# Patient Record
Sex: Male | Born: 1995 | Race: Black or African American | Hispanic: No | Marital: Single | Smoking: Never smoker
Health system: Southern US, Community
[De-identification: ages and names within clinical notes are randomized; demographics above are authoritative.]

## PROBLEM LIST (undated history)

## (undated) DIAGNOSIS — F329 Major depressive disorder, single episode, unspecified: Secondary | ICD-10-CM

## (undated) DIAGNOSIS — K589 Irritable bowel syndrome without diarrhea: Secondary | ICD-10-CM

## (undated) DIAGNOSIS — F32A Depression, unspecified: Secondary | ICD-10-CM

## (undated) DIAGNOSIS — F419 Anxiety disorder, unspecified: Secondary | ICD-10-CM

## (undated) HISTORY — DX: Anxiety disorder, unspecified: F41.9

## (undated) HISTORY — DX: Major depressive disorder, single episode, unspecified: F32.9

## (undated) HISTORY — DX: Depression, unspecified: F32.A

## (undated) HISTORY — PX: TYMPANOSTOMY TUBE PLACEMENT: SHX32

---
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
## 2017-09-03 ENCOUNTER — Encounter: Payer: Self-pay | Admitting: *Deleted

## 2017-09-03 ENCOUNTER — Other Ambulatory Visit: Payer: Self-pay

## 2017-09-03 ENCOUNTER — Emergency Department
Admission: EM | Admit: 2017-09-03 | Discharge: 2017-09-03 | Disposition: A | Discharge status: Home / Self Care | Payer: BLUE CROSS/BLUE SHIELD | Attending: Emergency Medicine | Admitting: Emergency Medicine

## 2017-09-03 DIAGNOSIS — R112 Nausea with vomiting, unspecified: Secondary | ICD-10-CM | POA: Insufficient documentation

## 2017-09-03 DIAGNOSIS — F4010 Social phobia, unspecified: Secondary | ICD-10-CM | POA: Diagnosis not present

## 2017-09-03 DIAGNOSIS — F329 Major depressive disorder, single episode, unspecified: Secondary | ICD-10-CM | POA: Diagnosis not present

## 2017-09-03 DIAGNOSIS — R1010 Upper abdominal pain, unspecified: Secondary | ICD-10-CM | POA: Insufficient documentation

## 2017-09-03 LAB — CBC
HCT: 43 % (ref 40.0–52.0)
HEMOGLOBIN: 14.8 g/dL (ref 13.0–18.0)
MCH: 29.3 pg (ref 26.0–34.0)
MCHC: 34.5 g/dL (ref 32.0–36.0)
MCV: 84.8 fL (ref 80.0–100.0)
Platelets: 395 10*3/uL (ref 150–440)
RBC: 5.06 MIL/uL (ref 4.40–5.90)
RDW: 12.6 % (ref 11.5–14.5)
WBC: 16 10*3/uL — ABNORMAL HIGH (ref 3.8–10.6)

## 2017-09-03 LAB — COMPREHENSIVE METABOLIC PANEL
ALK PHOS: 74 U/L (ref 38–126)
ALT: 41 U/L (ref 17–63)
ANION GAP: 8 (ref 5–15)
AST: 28 U/L (ref 15–41)
Albumin: 4.4 g/dL (ref 3.5–5.0)
BILIRUBIN TOTAL: 0.5 mg/dL (ref 0.3–1.2)
BUN: 10 mg/dL (ref 6–20)
CALCIUM: 9.4 mg/dL (ref 8.9–10.3)
CO2: 25 mmol/L (ref 22–32)
Chloride: 109 mmol/L (ref 101–111)
Creatinine, Ser: 1.24 mg/dL (ref 0.61–1.24)
GFR calc Af Amer: >60 mL/min (ref >60)
Glucose, Bld: 158 mg/dL — ABNORMAL HIGH (ref 65–99)
Potassium: 4.1 mmol/L (ref 3.5–5.1)
Sodium: 142 mmol/L (ref 135–145)
TOTAL PROTEIN: 7.8 g/dL (ref 6.5–8.1)

## 2017-09-03 LAB — LIPASE, BLOOD: Lipase: 27 U/L (ref 11–51)

## 2017-09-03 NOTE — ED Notes (Signed)
Pt given juice and crackers for po challenge per MD.

## 2017-09-03 NOTE — ED Triage Notes (Signed)
Pt has abd pain with vomiting after eating 2 hot dogs at 2100 tonight.  No diarrhea.  Pt alert.

## 2017-09-03 NOTE — ED Notes (Signed)
Pt unable to void at this time. 

## 2017-09-03 NOTE — ED Provider Notes (Signed)
[facility] Emergency Department Provider Note  ___________________________________________   First MD Initiated Contact with Patient 09/03/17 [phone]     (approximate)  I have reviewed the triage vital signs and the nursing notes.   HISTORY  Chief Complaint Abdominal Pain   HPI Jesse Bailey is a 22 y.o. male with a history of depression and social anxiety disorder who is presenting to the emergency department with upper abdominal pain that has since resolved.  He says that at about 9:00 yesterday he ate 2 hotdogs quickly.  He said he began to have upper abdominal cramping which became severe and lasted for about 3 hours until after he vomited.  He said that he then took a nap and ever since waking up he has felt fine.  Denies any diarrhea.  Denies any abdominal pain at this time and also does not report lower abdominal pain at any point during his course over the last 12 hours.  No past medical history on file.  Patient Active Problem List   Diagnosis Date Noted  . Moderate major depression, single episode ([facility]) 12/21/2016  . Social anxiety disorder 12/21/2016    No past surgical history on file.  Prior to Admission medications   Not on File    Allergies Patient has no known allergies.  No family history on file.  Social History Social History   Tobacco Use  . Smoking status: Never Smoker  . Smokeless tobacco: Never Used  Substance Use Topics  . Alcohol use: No  . Drug use: No    Review of Systems  Constitutional: No fever/chills Eyes: No visual changes. ENT: No sore throat. Cardiovascular: Denies chest pain. Respiratory: Denies shortness of breath. Gastrointestinal: No diarrhea.  No constipation. Genitourinary: Negative for dysuria. Musculoskeletal: Negative for back pain. Skin: Negative for rash. Neurological: Negative for headaches, focal weakness or numbness.   ____________________________________________   PHYSICAL  EXAM:  VITAL SIGNS: ED Triage Vitals  Enc Vitals Group     BP 09/03/17 0238 131/87     Pulse Rate 09/03/17 0238 100     Resp 09/03/17 0238 20     Temp 09/03/17 0238 98.6 F (37 C)     Temp Source 09/03/17 0238 Oral     SpO2 09/03/17 0238 96 %     Weight 09/03/17 0236 230 lb (104.3 kg)     Height 09/03/17 0236  (1.803 m)     Head Circumference --      Peak Flow --      Pain Score 09/03/17 0236 10     Pain Loc --      Pain Edu? --      Excl. in GC? --     Constitutional: Alert and oriented. Well appearing and in no acute distress. Eyes: Conjunctivae are normal.  Head: Atraumatic. Nose: No congestion/rhinnorhea. Mouth/Throat: Mucous membranes are moist.  Neck: No stridor.   Cardiovascular: Normal rate, regular rhythm. Grossly normal heart sounds.   Respiratory: Normal respiratory effort.  No retractions. Lungs CTAB. Gastrointestinal: Soft and nontender.  Specifically, no tender over McBurney's point or any focal tenderness no distention. No CVA tenderness. Musculoskeletal: No lower extremity tenderness nor edema.  No joint effusions. Neurologic:  Normal speech and language. No gross focal neurologic deficits are appreciated. Skin:  Skin is warm, dry and intact. No rash noted. Psychiatric: Mood and affect are normal. Speech and behavior are normal.  ____________________________________________   LABS (all labs ordered are listed, but only abnormal results  are displayed)  Labs Reviewed  COMPREHENSIVE METABOLIC PANEL - Abnormal; Notable for the following components:      Result Value   Glucose, Bld 158 (*)    All other components within normal limits  CBC - Abnormal; Notable for the following components:   WBC 16.0 (*)    All other components within normal limits  LIPASE, BLOOD  URINALYSIS, COMPLETE (UACMP) WITH MICROSCOPIC    ____________________________________________  EKG   ____________________________________________  RADIOLOGY   ____________________________________________   PROCEDURES  Procedure(s) performed:   Procedures  Critical Care performed:   ____________________________________________   INITIAL IMPRESSION / ASSESSMENT AND PLAN / ED COURSE  Pertinent labs & imaging results that were available during my care of the patient were reviewed by me and considered in my medical decision making (see chart for details).  Differential diagnosis includes, but is not limited to, biliary disease (biliary colic, acute cholecystitis, cholangitis, choledocholithiasis, etc), intrathoracic causes for epigastric abdominal pain including ACS, gastritis, duodenitis, pancreatitis, small bowel or large bowel obstruction, abdominal aortic aneurysm, hernia, and gastritis. As part of my medical decision making, I reviewed the following data within the electronic MEDICAL RECORD NUMBER Notes from prior ED visits  ----------------------------------------- 6:50 AM on 09/03/2017 -----------------------------------------  Patient unable to tolerate crackers and juice.  Repalpated the patient's abdomen and he continues to be soft and nontender throughout.  Denies any nausea at this time.  States that he thinks he ate the hotdogs very fast last night.  He says that his normal diet consists of fried food and pizza.  He says he does drink water but does not eat fruits and vegetables.  White blood cell count of 16 but benign abdominal exam throughout the time he has been in the examination area.  Tolerating solids and liquids at this time.  Does not seem that he has an acute abdominal issue that requires surgery such as appendicitis.  However, we did discuss return precautions including any worsening or concerning symptoms but especially abdominal pain and nausea and vomiting.  We also discussed better eating habits such as  incorporating fruits and vegetables.  The patient says that over the past 1 to 2 weeks he has been having regular bowel movements every other day.  However, his mother states that he has had a history of difficulty with moving his bowels.  We all agreed that the dietary changes would be the best way to improve this issue.  The patient as well as the patient's mother who is accompanying him is understanding of this plan willing to comply. ____________________________________________   FINAL CLINICAL IMPRESSION(S) / ED DIAGNOSES  Abdominal pain with nausea and vomiting.    NEW MEDICATIONS STARTED DURING THIS VISIT:  New Prescriptions   No medications on file     Note:  This document was prepared using Dragon voice recognition software and may include unintentional dictation errors.     Myrna Blazer, MD 09/03/17 [phone]

## 2017-10-21 ENCOUNTER — Emergency Department: Payer: BLUE CROSS/BLUE SHIELD

## 2017-10-21 ENCOUNTER — Encounter: Payer: Self-pay | Admitting: Emergency Medicine

## 2017-10-21 ENCOUNTER — Emergency Department
Admission: EM | Admit: 2017-10-21 | Discharge: 2017-10-21 | Disposition: A | Discharge status: Home / Self Care | Payer: BLUE CROSS/BLUE SHIELD | Attending: Emergency Medicine | Admitting: Emergency Medicine

## 2017-10-21 DIAGNOSIS — R112 Nausea with vomiting, unspecified: Secondary | ICD-10-CM | POA: Diagnosis not present

## 2017-10-21 DIAGNOSIS — R1084 Generalized abdominal pain: Secondary | ICD-10-CM | POA: Insufficient documentation

## 2017-10-21 DIAGNOSIS — R109 Unspecified abdominal pain: Secondary | ICD-10-CM

## 2017-10-21 LAB — LIPASE, BLOOD: LIPASE: 26 U/L (ref 11–51)

## 2017-10-21 LAB — CBC
HCT: 46.8 % (ref 40.0–52.0)
Hemoglobin: 16.1 g/dL (ref 13.0–18.0)
MCH: 29.3 pg (ref 26.0–34.0)
MCHC: 34.5 g/dL (ref 32.0–36.0)
MCV: 85 fL (ref 80.0–100.0)
Platelets: 255 10*3/uL (ref 150–440)
RBC: 5.51 MIL/uL (ref 4.40–5.90)
RDW: 13.2 % (ref 11.5–14.5)
WBC: 11.3 10*3/uL — ABNORMAL HIGH (ref 3.8–10.6)

## 2017-10-21 LAB — COMPREHENSIVE METABOLIC PANEL WITH GFR
ALT: 39 U/L (ref 17–63)
AST: 29 U/L (ref 15–41)
Albumin: 5.1 g/dL — ABNORMAL HIGH (ref 3.5–5.0)
Alkaline Phosphatase: 74 U/L (ref 38–126)
Anion gap: 9 (ref 5–15)
BUN: 13 mg/dL (ref 6–20)
CO2: 24 mmol/L (ref 22–32)
Calcium: 9.6 mg/dL (ref 8.9–10.3)
Chloride: 105 mmol/L (ref 101–111)
Creatinine, Ser: 1.08 mg/dL (ref 0.61–1.24)
GFR calc Af Amer: >60 mL/min
GFR calc non Af Amer: >60 mL/min
Glucose, Bld: 124 mg/dL — ABNORMAL HIGH (ref 65–99)
Potassium: 3.8 mmol/L (ref 3.5–5.1)
Sodium: 138 mmol/L (ref 135–145)
Total Bilirubin: 0.9 mg/dL (ref 0.3–1.2)
Total Protein: 7.9 g/dL (ref 6.5–8.1)

## 2017-10-21 LAB — URINALYSIS, COMPLETE (UACMP) WITH MICROSCOPIC
BACTERIA UA: NONE SEEN
BILIRUBIN URINE: NEGATIVE
Glucose, UA: NEGATIVE mg/dL
KETONES UR: NEGATIVE mg/dL
Leukocytes, UA: NEGATIVE
Nitrite: NEGATIVE
PH: 6 (ref 5.0–8.0)
PROTEIN: NEGATIVE mg/dL
Specific Gravity, Urine: 1.015 (ref 1.005–1.030)

## 2017-10-21 MED ORDER — ONDANSETRON 4 MG PO TBDP: 4.0000 mg | ORAL_TABLET | Freq: Three times a day (TID) | ORAL | 0 refills | Status: DC | PRN

## 2017-10-21 NOTE — ED Provider Notes (Signed)
[facility] Emergency Department Provider Note       Time seen: ----------------------------------------- 2:17 PM on 10/21/2017 -----------------------------------------   I have reviewed the triage vital signs and the nursing notes.  HISTORY   Chief Complaint Abdominal Pain    HPI Gary Fleetrajan A Cilento is a 22 y.o. male with a history of depression and social anxiety who presents to the ED for severe right-sided abdominal pain with vomiting that occurred at home.  This differs from the triage note which states that left-sided abdominal pain.  Patient states the same thing happened about a month ago when he was seen here.  Pain seemed to start this morning he was not eating when the event occurred.  1 month ago when he was seen here he was eating too quickly when the pain started.  He had vomiting and then spontaneous resolution of his symptoms.  He denies any diarrhea or constipation.  History reviewed. No pertinent past medical history.  Patient Active Problem List   Diagnosis Date Noted  . Moderate major depression, single episode (HCC) 12/21/2016  . Social anxiety disorder 12/21/2016    History reviewed. No pertinent surgical history.  Allergies Patient has no known allergies.  Social History Social History   Tobacco Use  . Smoking status: Never Smoker  . Smokeless tobacco: Never Used  Substance Use Topics  . Alcohol use: No  . Drug use: No   Review of Systems Constitutional: Negative for fever. Cardiovascular: Negative for chest pain. Respiratory: Negative for shortness of breath. Gastrointestinal: Positive for recent abdominal pain and vomiting Musculoskeletal: Negative for back pain. Skin: Negative for rash. Neurological: Negative for headaches, focal weakness or numbness.  All systems negative/normal/unremarkable except as stated in the HPI  ____________________________________________   PHYSICAL EXAM:  VITAL SIGNS: ED Triage  Vitals  Enc Vitals Group     BP 10/21/17 1228 (!) 148/103     Pulse Rate 10/21/17 1228 97     Resp 10/21/17 1228 18     Temp 10/21/17 1228 97.6 F (36.4 C)     Temp Source 10/21/17 1228 Oral     SpO2 10/21/17 1228 99 %     Weight --      Height --      Head Circumference --      Peak Flow --      Pain Score 10/21/17 1338 0     Pain Loc --      Pain Edu? --      Excl. in GC? --    Constitutional: Alert and oriented. Well appearing and in no distress. Eyes: Conjunctivae are normal. Normal extraocular movements. ENT   Head: Normocephalic and atraumatic.   Nose: No congestion/rhinnorhea.   Mouth/Throat: Mucous membranes are moist.   Neck: No stridor. Cardiovascular: Normal rate, regular rhythm. No murmurs, rubs, or gallops. Respiratory: Normal respiratory effort without tachypnea nor retractions. Breath sounds are clear and equal bilaterally. No wheezes/rales/rhonchi. Gastrointestinal: Soft and nontender. Normal bowel sounds Musculoskeletal: Nontender with normal range of motion in extremities. No lower extremity tenderness nor edema. Neurologic:  Normal speech and language. No gross focal neurologic deficits are appreciated.  Skin:  Skin is warm, dry and intact. No rash noted. Psychiatric: Mood and affect are normal. Speech and behavior are normal.  ____________________________________________  ED COURSE:  As part of my medical decision making, I reviewed the following data within the electronic MEDICAL RECORD NUMBER History obtained from family if available, nursing notes, old chart and ekg, as well  as notes from prior ED visits. Patient presented for abdominal pain, we will assess with labs and imaging as indicated at this time.   Procedures ____________________________________________   LABS (pertinent positives/negatives)  Labs Reviewed  COMPREHENSIVE METABOLIC PANEL - Abnormal; Notable for the following components:      Result Value   Glucose, Bld 124 (*)     Albumin 5.1 (*)    All other components within normal limits  CBC - Abnormal; Notable for the following components:   WBC 11.3 (*)    All other components within normal limits  URINALYSIS, COMPLETE (UACMP) WITH MICROSCOPIC - Abnormal; Notable for the following components:   Color, Urine YELLOW (*)    APPearance CLEAR (*)    Hgb urine dipstick LARGE (*)    All other components within normal limits  LIPASE, BLOOD    RADIOLOGY CT renal protocol IMPRESSION: 1. No acute findings. 2. Small left inguinal hernia containing only mesenteric fat.  ____________________________________________  DIFFERENTIAL DIAGNOSIS   Gas pain, constipation, gastroenteritis, renal colic, appendicitis unlikely  FINAL ASSESSMENT AND PLAN  Flank pain, vomiting, hematuria   Plan: The patient had presented for flank pain and vomiting. Patient's labs did reveal mild leukocytosis. Patient's imaging was negative.  No clear etiology for his symptoms.  He did have some blood in his urine so it is always possible that he had a kidney stone which he passed.  He does not describe sharp pain it was more of a dull pain in the right flank.  Overall he is cleared for outpatient follow-up.   Ulice Dash, MD   Note: This note was generated in part or whole with voice recognition software. Voice recognition is usually quite accurate but there are transcription errors that can and very often do occur. I apologize for any typographical errors that were not detected and corrected.     Emily Filbert, MD 10/21/17 1440

## 2017-10-21 NOTE — ED Notes (Signed)
ED Provider at bedside. 

## 2017-10-21 NOTE — ED Triage Notes (Addendum)
Mom says pt has left side abd pain with vomiting.  Says same thing happened about a month ago and he was seen here. Started this am.

## 2017-11-29 ENCOUNTER — Ambulatory Visit (INDEPENDENT_AMBULATORY_CARE_PROVIDER_SITE_OTHER): Payer: BLUE CROSS/BLUE SHIELD | Admitting: Gastroenterology

## 2017-11-29 ENCOUNTER — Encounter: Payer: Self-pay | Admitting: Gastroenterology

## 2017-11-29 VITALS — BP 141/95 | HR 83 | Ht 69.0 in | Wt 234.8 lb

## 2017-11-29 DIAGNOSIS — K5900 Constipation, unspecified: Secondary | ICD-10-CM

## 2017-11-29 DIAGNOSIS — R109 Unspecified abdominal pain: Secondary | ICD-10-CM | POA: Diagnosis not present

## 2017-11-29 NOTE — Patient Instructions (Signed)
High-Fiber Diet  Fiber, also called dietary fiber, is a type of carbohydrate found in fruits, vegetables, whole grains, and beans. A high-fiber diet can have many health benefits. Your health care provider may recommend a high-fiber diet to help:  · Prevent constipation. Fiber can make your bowel movements more regular.  · Lower your cholesterol.  · Relieve hemorrhoids, uncomplicated diverticulosis, or irritable bowel syndrome.  · Prevent overeating as part of a weight-loss plan.  · Prevent heart disease, type 2 diabetes, and certain cancers.    What is my plan?  The recommended daily intake of fiber includes:  · 38 grams for men under age 50.  · 30 grams for men over age 50.  · 25 grams for women under age 50.  · 21 grams for women over age 50.    You can get the recommended daily intake of dietary fiber by eating a variety of fruits, vegetables, grains, and beans. Your health care provider may also recommend a fiber supplement if it is not possible to get enough fiber through your diet.  What do I need to know about a high-fiber diet?  · Fiber supplements have not been widely studied for their effectiveness, so it is better to get fiber through food sources.  · Always check the fiber content on the nutrition facts label of any prepackaged food. Look for foods that contain at least 5 grams of fiber per serving.  · Ask your dietitian if you have questions about specific foods that are related to your condition, especially if those foods are not listed in the following section.  · Increase your daily fiber consumption gradually. Increasing your intake of dietary fiber too quickly may cause bloating, cramping, or gas.  · Drink plenty of water. Water helps you to digest fiber.  What foods can I eat?  Grains  Whole-grain breads. Multigrain cereal. Oats and oatmeal. Brown rice. Barley. Bulgur wheat. Millet. Bran muffins. Popcorn. Rye wafer crackers.  Vegetables   Sweet potatoes. Spinach. Kale. Artichokes. Cabbage. Broccoli. Green peas. Carrots. Squash.  Fruits  Berries. Pears. Apples. Oranges. Avocados. Prunes and raisins. Dried figs.  Meats and Other Protein Sources  Navy, kidney, pinto, and soy beans. Split peas. Lentils. Nuts and seeds.  Dairy  Fiber-fortified yogurt.  Beverages  Fiber-fortified soy milk. Fiber-fortified orange juice.  Other  Fiber bars.  The items listed above may not be a complete list of recommended foods or beverages. Contact your dietitian for more options.  What foods are not recommended?  Grains  White bread. Pasta made with refined flour. White rice.  Vegetables  Fried potatoes. Canned vegetables. Well-cooked vegetables.  Fruits  Fruit juice. Cooked, strained fruit.  Meats and Other Protein Sources  Fatty cuts of meat. Fried poultry or fried fish.  Dairy  Milk. Yogurt. Cream cheese. Sour cream.  Beverages  Soft drinks.  Other  Cakes and pastries. Butter and oils.  The items listed above may not be a complete list of foods and beverages to avoid. Contact your dietitian for more information.  What are some tips for including high-fiber foods in my diet?  · Eat a wide variety of high-fiber foods.  · Make sure that half of all grains consumed each day are whole grains.  · Replace breads and cereals made from refined flour or white flour with whole-grain breads and cereals.  · Replace white rice with brown rice, bulgur wheat, or millet.  · Start the day with a breakfast that is high in fiber,   such as a cereal that contains at least 5 grams of fiber per serving.  · Use beans in place of meat in soups, salads, or pasta.  · Eat high-fiber snacks, such as berries, raw vegetables, nuts, or popcorn.  This information is not intended to replace advice given to you by your health care provider. Make sure you discuss any questions you have with your health care provider.  Document Released: 04/24/2005 Document Revised: 09/30/2015 Document Reviewed: 10/07/2013   Elsevier Interactive Patient Education © 2018 [employer]vier Inc[employer]

## 2017-11-29 NOTE — Progress Notes (Signed)
Jonathon Bellows MD, MRCP(U.K) [address]  [place]  [place], [place] [address]  Main: [phone]  Fax: [phone]   Gastroenterology Consultation  Referring Provider:     [facility] Primary Care Physician:  [facility] Primary Gastroenterologist:  Dr. Jonathon Bellows  Reason for Consultation:     Abdominal pain         HPI:   BOBY EYER is a 22 y.o. y/o male referred for consultation & management  by Dr. Shari Prows, [facility].    He has been referred for abdominal pain. He was seen in the ER on 10/21/17 for right sided abdominal pain and vomiting , similar episode in 08/2017 , the pain resolved spontaneously. He had some blood in his urine and there was concern for passing a renal stone. CT renal stone study showed only a small left inguinal hernia. LFT's,CBC and lipase showed no significant abnormality .    Abdominal pain: Onset: All began in 08/2017 , just had two episodes of pain , none since  Site :He says that the first episode was eating two hot dogs and his belly began to hurt , the second time it also started hurting when he woke. All over his abdomen , rt side on the second occasions. The first occasion lasted for 2 hours and the second occasion also for 2 hours , nausea+vomiting was also noted.  Radiation: no  Severity :the first time was severe but the second time was less severe  Petra Kuba of pain: like a knot  Aggravating factors: moving  Relieving factors :on its own  Weight loss: none  NSAID use: none  PPI use :none - denies any heart burn  Gall bladder surgery: no - none in the family had issues with gall bladder  Frequency of bowel movements: once a week - cant recall if he had a bowel movement prior to his ER visit  Change in bowel movements: no  Relief with bowel movements: yes  Gas/Bloating/Abdominal distension: yes     Hepatic Function Latest Ref Rng & Units 10/21/2017 09/03/2017 12/21/2016  Total Protein 6.5 - 8.1 g/dL  7.9 7.8 7.9  Albumin 3.5 - 5.0 g/dL 5.1(H) 4.4 4.9  AST 15 - 41 U/L 29 28 35  ALT 17 - 63 U/L 39 41 47  Alk Phosphatase 38 - 126 U/L 74 74 98  Total Bilirubin 0.3 - 1.2 mg/dL 0.9 0.5 0.8     No past medical history on file.  No past surgical history on file.  Prior to Admission medications   Medication Sig Start Date End Date Taking? Authorizing Provider  ondansetron (ZOFRAN ODT) 4 MG disintegrating tablet Take 1 tablet (4 mg total) by mouth every 8 (eight) hours as needed for nausea or vomiting. 10/21/17   Earleen Newport, MD    No family history on file.   Social History   Tobacco Use  . Smoking status: Never Smoker  . Smokeless tobacco: Never Used  Substance Use Topics  . Alcohol use: No  . Drug use: No    Allergies as of 11/29/2017  . (No Known Allergies)    Review of Systems:    All systems reviewed and negative except where noted in HPI.   Physical Exam:  There were no vitals taken for this visit. No LMP for male patient. Psych:  Alert and cooperative. Normal mood and affect. General:   Alert,  Well-developed, well-nourished, pleasant and cooperative in NAD Head:  Normocephalic and atraumatic. Eyes:  Sclera clear, no icterus.   Conjunctiva pink. Ears:  Normal auditory acuity. Nose:  No deformity, discharge, or lesions. Mouth:  No deformity or lesions,oropharynx pink & moist. Neck:  Supple; no masses or thyromegaly. Lungs:  Respirations even and unlabored.  Clear throughout to auscultation.   No wheezes, crackles, or rhonchi. No acute distress. Heart:  Regular rate and rhythm; no murmurs, clicks, rubs, or gallops. Abdomen:  Normal bowel sounds.  No bruits.  Soft, non-tender and non-distended without masses, hepatosplenomegaly or hernias noted.  No guarding or rebound tenderness.    Neurologic:  Alert and oriented x3;  grossly normal neurologically. Skin:  Intact without significant lesions or rashes. No jaundice. Lymph Nodes:  No significant cervical  adenopathy. Psych:  Alert and cooperative. Normal mood and affect.  Imaging Studies: No results found.  Assessment and Plan:   CONROY GORACKE is a 22 y.o. y/o male has been referred for  abdominal pain . The episodes occurred only on two occasions and has resolved since . Normal LFT's . Differentials are food poisoning +/- IBS-C. Diet devoid of fiber.   Plan  1. High fiber diet - patient information provided 2. Fiber pills provided samples 3. Daily miralax.    Follow up in 4 weeks  Dr Jonathon Bellows MD,MRCP(U.K)

## 2018-01-03 ENCOUNTER — Encounter: Payer: Self-pay | Admitting: Gastroenterology

## 2018-01-03 ENCOUNTER — Ambulatory Visit: Payer: BLUE CROSS/BLUE SHIELD | Admitting: Gastroenterology

## 2018-03-24 ENCOUNTER — Other Ambulatory Visit: Payer: Self-pay

## 2018-03-24 ENCOUNTER — Encounter: Payer: Self-pay | Admitting: Emergency Medicine

## 2018-03-24 ENCOUNTER — Ambulatory Visit (INDEPENDENT_AMBULATORY_CARE_PROVIDER_SITE_OTHER)
Admission: EM | Admit: 2018-03-24 | Discharge: 2018-03-24 | Disposition: A | Discharge status: Home / Self Care | Payer: BLUE CROSS/BLUE SHIELD | Source: Home / Self Care | Attending: Emergency Medicine | Admitting: Emergency Medicine

## 2018-03-24 ENCOUNTER — Emergency Department
Admission: EM | Admit: 2018-03-24 | Discharge: 2018-03-24 | Disposition: A | Discharge status: Home / Self Care | Payer: BLUE CROSS/BLUE SHIELD | Attending: Emergency Medicine | Admitting: Emergency Medicine

## 2018-03-24 DIAGNOSIS — R1111 Vomiting without nausea: Secondary | ICD-10-CM | POA: Diagnosis not present

## 2018-03-24 DIAGNOSIS — R1110 Vomiting, unspecified: Secondary | ICD-10-CM | POA: Insufficient documentation

## 2018-03-24 DIAGNOSIS — Z5321 Procedure and treatment not carried out due to patient leaving prior to being seen by health care provider: Secondary | ICD-10-CM | POA: Diagnosis not present

## 2018-03-24 LAB — COMPREHENSIVE METABOLIC PANEL
ALK PHOS: 85 U/L (ref 38–126)
ALT: 36 U/L (ref 0–44)
ANION GAP: 10 (ref 5–15)
AST: 27 U/L (ref 15–41)
Albumin: 5 g/dL (ref 3.5–5.0)
BILIRUBIN TOTAL: 0.7 mg/dL (ref 0.3–1.2)
BUN: 15 mg/dL (ref 6–20)
CALCIUM: 9.7 mg/dL (ref 8.9–10.3)
CO2: 26 mmol/L (ref 22–32)
Chloride: 106 mmol/L (ref 98–111)
Creatinine, Ser: 1.33 mg/dL — ABNORMAL HIGH (ref 0.61–1.24)
GFR calc non Af Amer: >60 mL/min (ref >60)
Glucose, Bld: 130 mg/dL — ABNORMAL HIGH (ref 70–99)
POTASSIUM: 4.5 mmol/L (ref 3.5–5.1)
Sodium: 142 mmol/L (ref 135–145)
TOTAL PROTEIN: 8.2 g/dL — AB (ref 6.5–8.1)

## 2018-03-24 LAB — LIPASE, BLOOD: Lipase: 20 U/L (ref 11–51)

## 2018-03-24 LAB — CBC WITH DIFFERENTIAL/PLATELET
Abs Immature Granulocytes: 0.08 10*3/uL — ABNORMAL HIGH (ref 0.00–0.07)
BASOS PCT: 0 %
Basophils Absolute: 0 10*3/uL (ref 0.0–0.1)
EOS ABS: 0 10*3/uL (ref 0.0–0.5)
EOS PCT: 0 %
HEMATOCRIT: 47.5 % (ref 39.0–52.0)
Hemoglobin: 16.2 g/dL (ref 13.0–17.0)
IMMATURE GRANULOCYTES: 1 %
LYMPHS PCT: 9 %
Lymphs Abs: 1.4 10*3/uL (ref 0.7–4.0)
MCH: 29.3 pg (ref 26.0–34.0)
MCHC: 34.1 g/dL (ref 30.0–36.0)
MCV: 85.9 fL (ref 80.0–100.0)
MONO ABS: 0.8 10*3/uL (ref 0.1–1.0)
MONOS PCT: 5 %
Neutro Abs: 13.2 10*3/uL — ABNORMAL HIGH (ref 1.7–7.7)
Neutrophils Relative %: 85 %
PLATELETS: 266 10*3/uL (ref 150–400)
RBC: 5.53 MIL/uL (ref 4.22–5.81)
RDW: 12.5 % (ref 11.5–15.5)
WBC: 15.5 10*3/uL — ABNORMAL HIGH (ref 4.0–10.5)
nRBC: 0 % (ref 0.0–0.2)

## 2018-03-24 LAB — URINALYSIS, COMPLETE (UACMP) WITH MICROSCOPIC
Glucose, UA: NEGATIVE mg/dL
Hgb urine dipstick: NEGATIVE
LEUKOCYTES UA: NEGATIVE
Nitrite: NEGATIVE
PROTEIN: 30 mg/dL — AB
Specific Gravity, Urine: >1.03 — ABNORMAL HIGH (ref 1.005–1.030)
pH: 6 (ref 5.0–8.0)

## 2018-03-24 MED ORDER — SODIUM CHLORIDE 0.9 % IV BOLUS
1000.0000 mL | Freq: Once | INTRAVENOUS | Status: AC
  Administered 2018-03-24: 1000 mL via INTRAVENOUS

## 2018-03-24 MED ORDER — PROMETHAZINE HCL 25 MG/ML IJ SOLN
25.0000 mg | Freq: Once | INTRAMUSCULAR | Status: AC
  Administered 2018-03-24: 25 mg via INTRAMUSCULAR

## 2018-03-24 MED ORDER — KETOROLAC TROMETHAMINE 30 MG/ML IJ SOLN
30.0000 mg | Freq: Once | INTRAMUSCULAR | Status: AC
  Administered 2018-03-24: 30 mg via INTRAVENOUS

## 2018-03-24 NOTE — Discharge Instructions (Addendum)
[facility].  Do not have anything to eat or drink until his evaluation is complete.

## 2018-03-24 NOTE — ED Triage Notes (Signed)
Pt not currently vomiting in triage.

## 2018-03-24 NOTE — ED Notes (Signed)
Pedialyte given for po challenge °

## 2018-03-24 NOTE — ED Triage Notes (Signed)
[facility] seen [facility] this afternoon for vomiting since this morning. Pt reports he has had multiple episodes of vomiting, last episode was at the urgent care.  Pt given zofran and phenergan. [facility]  Pt denies any sxs of nausea at this time.    Pt denies any pain. Pt has no sick contacts, denies any drug or alcohol use.

## 2018-03-24 NOTE — ED Provider Notes (Signed)
HPI  SUBJECTIVE:  Jesse Bailey is a 22 y.o. male who presents with multiple episodes of non-bloody, bilious vomiting starting this morning.  He estimates that he has had over 10 episodes.  He reports throbbing, intermittent, hours along right flank/abdominal pain with this. The Abdominal pain is not associated with vomiting, walking, movement. patient States that the car ride here was not painful.  Mother states he is unable to tolerate p.o. at all.  He denies nausea, diarrhea, fevers, abdominal distention.  No change in urine output.  No lightheadedness, dizziness, syncope.  No raw or undercooked foods, questionable leftovers, contacts with similar symptoms.  He had a normal bowel movement this morning.  No back pain.  No dysuria, urgency, frequency, cloudy or odorous urine, hematuria.  No alcohol or marijuana use.  No headache.  No GU complaints.  Symptoms are  identical to 2 previous episodes of vomiting that he has had this year.  Mother gave him 4 mg of Zofran without improvement in his symptoms.  There are no aggravating factors.  Twice seen in the ER this year for this.  Visit on 4/29 for the same, he had a mild leukocytosis at 18.  Then on 6/16 for the same, he had large hematuria, so a noncontrast CT was done.  It was negative for nephrolithiasis.  Small left fat-containing inguinal hernia noted.  Evaluated by GI on 7/25.  Thought to have constipation, possibly irritable bowel syndrome versus food poisoning.  He was sent home with fiber pills, high-fiber diet, daily MiraLAX and told to follow-up in 4 weeks.  History reviewed. No pertinent past medical history.  History reviewed. No pertinent surgical history.  Family History  Problem Relation Age of Onset  . Hypertension Mother   . Hyperlipidemia Father     Social History   Tobacco Use  . Smoking status: Never Smoker  . Smokeless tobacco: Never Used  Substance Use Topics  . Alcohol use: No  . Drug use: No    No current  facility-administered medications for this encounter.   Current Outpatient Medications:  .  ondansetron (ZOFRAN ODT) 4 MG disintegrating tablet, Take 1 tablet (4 mg total) by mouth every 8 (eight) hours as needed for nausea or vomiting., Disp: 20 tablet, Rfl: 0  No Known Allergies   ROS  As noted in HPI.   Physical Exam  BP (!) 143/89 (BP Location: Left Arm)   Pulse 80   Temp (!) 97.5 F (36.4 C) (Oral)   Resp 18   Wt 106.5 kg   SpO2 98%   BMI 34.67 kg/m   Constitutional: Well developed, well nourished, no acute distress and actively vomiting yellowish-greenish liquid. Eyes:  EOMI, conjunctiva normal bilaterally HENT: Normocephalic, atraumatic,mucus membranes moist Respiratory: Normal inspiratory effort Cardiovascular: Normal rate, normal rhythm, no murmurs, rubs, gallops GI: Normal appearance, soft, nontender, nondistended.  Negative Murphy, negative McBurney.  No appreciable masses.  No suprapubic, flank tenderness. Back: No CVAT skin: No rash, skin intact Musculoskeletal: no deformities Neurologic: Alert & oriented x 3, no focal neuro deficits Psychiatric: Speech and behavior appropriate   ED Course   Medications  promethazine (PHENERGAN) injection 25 mg (25 mg Intramuscular Given 03/24/18 1228)  sodium chloride 0.9 % bolus 1,000 mL (0 mLs Intravenous Stopped 03/24/18 1335)  ketorolac (TORADOL) 30 MG/ML injection 30 mg (30 mg Intravenous Given 03/24/18 1326)    Orders Placed This Encounter  Procedures  . Urine culture    Standing Status:   Standing  Number of Occurrences:   1    Order Specific Question:   Patient immune status    Answer:   Normal  . CBC with Differential    Standing Status:   Standing    Number of Occurrences:   1  . Lipase, blood    Standing Status:   Standing    Number of Occurrences:   1  . Comprehensive metabolic panel    Standing Status:   Standing    Number of Occurrences:   1  . Urinalysis, Complete w Microscopic    Standing  Status:   Standing    Number of Occurrences:   1  . Insert peripheral IV    Standing Status:   Standing    Number of Occurrences:   1    Results for orders placed or performed during the hospital encounter of 03/24/18 (from the past 24 hour(s))  CBC with Differential     Status: Abnormal   Collection Time: 03/24/18 12:45 PM  Result Value Ref Range   WBC 15.5 (H) 4.0 - 10.5 K/uL   RBC 5.53 4.22 - 5.81 MIL/uL   Hemoglobin 16.2 13.0 - 17.0 g/dL   HCT 16.1 09.6 - 04.5 %   MCV 85.9 80.0 - 100.0 fL   MCH 29.3 26.0 - 34.0 pg   MCHC 34.1 30.0 - 36.0 g/dL   RDW 40.9 81.1 - 91.4 %   Platelets 266 150 - 400 K/uL   nRBC 0.0 0.0 - 0.2 %   Neutrophils Relative % 85 %   Neutro Abs 13.2 (H) 1.7 - 7.7 K/uL   Lymphocytes Relative 9 %   Lymphs Abs 1.4 0.7 - 4.0 K/uL   Monocytes Relative 5 %   Monocytes Absolute 0.8 0.1 - 1.0 K/uL   Eosinophils Relative 0 %   Eosinophils Absolute 0.0 0.0 - 0.5 K/uL   Basophils Relative 0 %   Basophils Absolute 0.0 0.0 - 0.1 K/uL   Immature Granulocytes 1 %   Abs Immature Granulocytes 0.08 (H) 0.00 - 0.07 K/uL  Lipase, blood     Status: None   Collection Time: 03/24/18 12:45 PM  Result Value Ref Range   Lipase 20 11 - 51 U/L  Comprehensive metabolic panel     Status: Abnormal   Collection Time: 03/24/18 12:45 PM  Result Value Ref Range   Sodium 142 135 - 145 mmol/L   Potassium 4.5 3.5 - 5.1 mmol/L   Chloride 106 98 - 111 mmol/L   CO2 26 22 - 32 mmol/L   Glucose, Bld 130 (H) 70 - 99 mg/dL   BUN 15 6 - 20 mg/dL   Creatinine, Ser 7.82 (H) 0.61 - 1.24 mg/dL   Calcium 9.7 8.9 - 95.6 mg/dL   Total Protein 8.2 (H) 6.5 - 8.1 g/dL   Albumin 5.0 3.5 - 5.0 g/dL   AST 27 15 - 41 U/L   ALT 36 0 - 44 U/L   Alkaline Phosphatase 85 38 - 126 U/L   Total Bilirubin 0.7 0.3 - 1.2 mg/dL   GFR calc non Af Amer >60 >60 mL/min   GFR calc Af Amer >60 >60 mL/min   Anion gap 10 5 - 15  Urinalysis, Complete w Microscopic     Status: Abnormal   Collection Time: 03/24/18  12:45 PM  Result Value Ref Range   Color, Urine YELLOW YELLOW   APPearance HAZY (A) CLEAR   Specific Gravity, Urine >1.030 (H) 1.005 - 1.030   pH 6.0  5.0 - 8.0   Glucose, UA NEGATIVE NEGATIVE mg/dL   Hgb urine dipstick NEGATIVE NEGATIVE   Bilirubin Urine SMALL (A) NEGATIVE   Ketones, ur TRACE (A) NEGATIVE mg/dL   Protein, ur 30 (A) NEGATIVE mg/dL   Nitrite NEGATIVE NEGATIVE   Leukocytes, UA NEGATIVE NEGATIVE   Squamous Epithelial / LPF 0-5 0 - 5   WBC, UA 0-5 0 - 5 WBC/hpf   RBC / HPF 0-5 0 - 5 RBC/hpf   Bacteria, UA MANY (A) NONE SEEN   Mucus PRESENT    Amorphous Crystal PRESENT    No results found.  ED Clinical Impression  Intractable vomiting without nausea, unspecified vomiting type   ED Assessment/Plan  I had mom give the patient 8 mg of Zofran, but the patient had persistent vomiting.  Giving him 25 mg of Phenergan.  We will check a UA, CBC, CMP, lipase, give him a liter of fluids.  His abdomen is soft, completely nontender, nondistended, I am not convinced that he has an obstruction, appendicitis, pancreatitis, cholecystitis, other biliary disease,  perforation.  He has been worked up by GI and in the ER in the past for the exact same thing, which included an abdominal CT.  His labs are normal except for a leukocytosis at 15.5.  UA was resulted after patient left.  He has ketones and small bilirubin his urine, and many bacteria however he has no nitrites or esterase.  We will send this off for culture.  I would be surprised if his symptoms are being caused by a UTI or STD as he has no urinary complaints.  On reevaluation, patient still complaining of abdominal pain.  Repeat abdominal exam benign.  will give 30 mg Toradol IV.  Pt states that the Toradol helped a little bit, but he still has abdominal pain. Notified by staff that patient is vomiting again, will transfer to the hospital for intractable vomiting despite 8 milligrams of Zofran and 25 mg of Phenergan.  He is stable  to go by private vehicle.  Discussed  transfer with Herbert SetaHeather, triage nurse.  Discussed rationale for transfer to the emergency department with patient and mother.  They agree with plan.  Meds ordered this encounter  Medications  . promethazine (PHENERGAN) injection 25 mg  . sodium chloride 0.9 % bolus 1,000 mL  . ketorolac (TORADOL) 30 MG/ML injection 30 mg    *This clinic note was created using Scientist, clinical (histocompatibility and immunogenetics)Dragon dictation software. Therefore, there may be occasional mistakes despite careful proofreading.   ?   Domenick GongMortenson, Jeter Tomey, MD 03/24/18 1356

## 2018-03-24 NOTE — ED Notes (Signed)
Pt reports that he is feeling better and no longer wants to be seen, pt reports he has not been sick anymore since he was [facility]. NAD noted. PT advised to return to ED if sx's return.

## 2018-03-24 NOTE — ED Triage Notes (Signed)
Pt here for vomiting since this morning along with nausea. Mom did give him a 4mg  zofran without relief. Is actively vomiting right now. Unable to keep fluids down.

## 2018-03-26 LAB — URINE CULTURE
CULTURE: NO GROWTH
Special Requests: NORMAL

## 2018-04-04 ENCOUNTER — Other Ambulatory Visit: Payer: Self-pay

## 2018-04-04 ENCOUNTER — Emergency Department: Payer: BLUE CROSS/BLUE SHIELD

## 2018-04-04 ENCOUNTER — Emergency Department
Admission: EM | Admit: 2018-04-04 | Discharge: 2018-04-04 | Disposition: A | Discharge status: Home / Self Care | Payer: BLUE CROSS/BLUE SHIELD | Attending: Emergency Medicine | Admitting: Emergency Medicine

## 2018-04-04 DIAGNOSIS — R1032 Left lower quadrant pain: Secondary | ICD-10-CM | POA: Diagnosis not present

## 2018-04-04 DIAGNOSIS — R109 Unspecified abdominal pain: Secondary | ICD-10-CM | POA: Diagnosis present

## 2018-04-04 LAB — COMPREHENSIVE METABOLIC PANEL
ALBUMIN: 4.6 g/dL (ref 3.5–5.0)
ALT: 36 U/L (ref 0–44)
ANION GAP: 11 (ref 5–15)
AST: 24 U/L (ref 15–41)
Alkaline Phosphatase: 76 U/L (ref 38–126)
BILIRUBIN TOTAL: 0.8 mg/dL (ref 0.3–1.2)
BUN: 9 mg/dL (ref 6–20)
CHLORIDE: 105 mmol/L (ref 98–111)
CO2: 25 mmol/L (ref 22–32)
Calcium: 9.6 mg/dL (ref 8.9–10.3)
Creatinine, Ser: 1.24 mg/dL (ref 0.61–1.24)
GFR calc Af Amer: >60 mL/min (ref >60)
GFR calc non Af Amer: >60 mL/min (ref >60)
GLUCOSE: 129 mg/dL — AB (ref 70–99)
POTASSIUM: 4.6 mmol/L (ref 3.5–5.1)
SODIUM: 141 mmol/L (ref 135–145)
Total Protein: 7.4 g/dL (ref 6.5–8.1)

## 2018-04-04 LAB — CBC WITH DIFFERENTIAL/PLATELET
Abs Immature Granulocytes: 0.06 10*3/uL (ref 0.00–0.07)
BASOS ABS: 0.1 10*3/uL (ref 0.0–0.1)
Basophils Relative: 0 %
EOS ABS: 0 10*3/uL (ref 0.0–0.5)
EOS PCT: 0 %
HEMATOCRIT: 45.2 % (ref 39.0–52.0)
HEMOGLOBIN: 15.8 g/dL (ref 13.0–17.0)
IMMATURE GRANULOCYTES: 1 %
LYMPHS ABS: 1.7 10*3/uL (ref 0.7–4.0)
LYMPHS PCT: 13 %
MCH: 29.5 pg (ref 26.0–34.0)
MCHC: 35 g/dL (ref 30.0–36.0)
MCV: 84.3 fL (ref 80.0–100.0)
Monocytes Absolute: 0.5 10*3/uL (ref 0.1–1.0)
Monocytes Relative: 4 %
NEUTROS PCT: 82 %
NRBC: 0 % (ref 0.0–0.2)
Neutro Abs: 10.9 10*3/uL — ABNORMAL HIGH (ref 1.7–7.7)
Platelets: 294 10*3/uL (ref 150–400)
RBC: 5.36 MIL/uL (ref 4.22–5.81)
RDW: 11.9 % (ref 11.5–15.5)
WBC: 13.3 10*3/uL — ABNORMAL HIGH (ref 4.0–10.5)

## 2018-04-04 LAB — LIPASE, BLOOD: Lipase: 23 U/L (ref 11–51)

## 2018-04-04 MED ORDER — IOPAMIDOL (ISOVUE-300) INJECTION 61%
100.0000 mL | Freq: Once | INTRAVENOUS | Status: AC | PRN
  Administered 2018-04-04: 100 mL via INTRAVENOUS

## 2018-04-04 MED ORDER — ONDANSETRON HCL 4 MG/2ML IJ SOLN
4.0000 mg | Freq: Once | INTRAMUSCULAR | Status: AC
  Administered 2018-04-04: 4 mg via INTRAVENOUS
  Filled 2018-04-04: qty 2

## 2018-04-04 MED ORDER — IOPAMIDOL (ISOVUE-300) INJECTION 61%
30.0000 mL | Freq: Once | INTRAVENOUS | Status: AC | PRN
  Administered 2018-04-04: 30 mL via ORAL

## 2018-04-04 NOTE — Discharge Instructions (Addendum)

## 2018-04-04 NOTE — ED Notes (Signed)
Pt only able to drink one bottle of contrast. Ok per Dr. Roxan Hockeyobinson to go ahead with CT. CT notified.

## 2018-04-04 NOTE — ED Provider Notes (Signed)
[facility] Emergency Department Provider Note _____   First MD Initiated Contact with Patient 04/04/18 [phone]     (approximate)  I have reviewed the triage vital signs and the nursing notes.   HISTORY  Chief Complaint Abdominal Pain  HPI Jesse Bailey is a 22 y.o. male below list of chronic medical conditions including anxiety depression and recent evaluation for abdominal pain on   Past Medical History Patient Active Problem List   Diagnosis Date Noted  . Moderate major depression, single episode (HCC) 12/21/2016  . Social anxiety disorder 12/21/2016    Past Surgical History None  Prior to Admission medications   Medication Sig Start Date End Date Taking? Authorizing Provider  ondansetron (ZOFRAN ODT) 4 MG disintegrating tablet Take 1 tablet (4 mg total) by mouth every 8 (eight) hours as needed for nausea or vomiting. 10/21/17   Emily Filbert, MD    Allergies None  Family History  Problem Relation Age of Onset  . Hypertension Mother   . Hyperlipidemia Father     Social History Social History   Tobacco Use  . Smoking status: Never Smoker  . Smokeless tobacco: Never Used  Substance Use Topics  . Alcohol use: No  . Drug use: No    Review of Systems Constitutional: No fever/chills Eyes: No visual changes. ENT: No sore throat. Cardiovascular: Denies chest pain. Respiratory: Denies shortness of breath. Gastrointestinal: positive for abdominal pain and vomiting  No diarrhea.  No constipation. Genitourinary: Negative for dysuria. Musculoskeletal: Negative for neck pain.  Negative for back pain. Integumentary: Negative for rash. Neurological: Negative for headaches, focal weakness or numbness.  ____________________________________________   PHYSICAL EXAM:  VITAL SIGNS: ED Triage Vitals  Enc Vitals Group     BP 04/04/18 0533 131/88     Pulse Rate 04/04/18 0533 90     Resp 04/04/18 0533 20     Temp 04/04/18 0533 97.8 F  (36.6 C)     Temp Source 04/04/18 0533 Oral     SpO2 04/04/18 0533 97 %     Weight 04/04/18 0532 95.3 kg (210 lb)     Height 04/04/18 0532 1.778 m (5\' 10" )     Head Circumference --      Peak Flow --      Pain Score 04/04/18 0532 5     Pain Loc --      Pain Edu? --      Excl. in GC? --     Constitutional: Alert and oriented. Well appearing and in no acute distress. Eyes: Conjunctivae are normal. Mouth/Throat: Mucous membranes are moist.  Oropharynx non-erythematous. Neck: No stridor.   Cardiovascular: Normal rate, regular rhythm. Good peripheral circulation. Grossly normal heart sounds. Respiratory: Normal respiratory effort.  No retractions. Lungs CTAB. Gastrointestinal: left lower quadrant/right lower quadrant tenderness to palpation. No distention.   Musculoskeletal: No lower extremity tenderness nor edema. No gross deformities of extremities. Neurologic:  Normal speech and language. No gross focal neurologic deficits are appreciated.  Skin:  Skin is warm, dry and intact. No rash noted. Psychiatric: Mood and affect are normal. Speech and behavior are normal.  ____________________________________________   LABS (all labs ordered are listed, but only abnormal results are displayed)  Labs Reviewed  CBC WITH DIFFERENTIAL/PLATELET - Abnormal; Notable for the following components:      Result Value   WBC 13.3 (*)    Neutro Abs 10.9 (*)    All other components within normal limits  COMPREHENSIVE METABOLIC PANEL  LIPASE, BLOOD     Procedures   ____________________________________________   INITIAL IMPRESSION / ASSESSMENT AND PLAN / ED COURSE  As part of my medical decision making, I reviewed the following data within the electronic MEDICAL RECORD NUMBER 22 year old male presenting with above stated history of physical exam secondary to abdominal pain and vomiting.given history of repetitive episodes intermittently concern for possible inflammatory bowel versus irritable  bowel. Also consider possibility of acute pathology such as or infectious colitisversus appendicitis or ureterolithiasis. CT scan of the abdomen and pelvis pending. Patient's care transferred to Dr. Roxan Hockeyobinson ____________________________________________  FINAL CLINICAL IMPRESSION(S) / ED DIAGNOSES  Final diagnoses:  Left lower quadrant abdominal pain     MEDICATIONS GIVEN DURING THIS VISIT:  Medications  ondansetron (ZOFRAN) injection 4 mg (4 mg Intravenous Given 04/04/18 0605)     ED Discharge Orders    None       Note:  This document was prepared using Dragon voice recognition software and may include unintentional dictation errors.    Darci CurrentBrown, [place] N, MD 04/04/18 [phone]

## 2018-04-04 NOTE — ED Triage Notes (Signed)
Pt In with co left sided abd pain that started tonight with vomiting.

## 2018-04-04 NOTE — ED Notes (Signed)
Pt discharged home with mother after verbalizing understanding of discharge instructions; nad noted. 

## 2018-04-04 NOTE — ED Provider Notes (Signed)
CT imaging is reassuring.  Patient's pain free.  He stable and appropriate for outpatient follow-up   Willy Eddyobinson, Mackena Plummer, MD 04/04/18 [phone]

## 2018-04-24 ENCOUNTER — Ambulatory Visit (INDEPENDENT_AMBULATORY_CARE_PROVIDER_SITE_OTHER): Payer: BLUE CROSS/BLUE SHIELD | Admitting: Gastroenterology

## 2018-04-24 ENCOUNTER — Other Ambulatory Visit: Payer: Self-pay

## 2018-04-24 ENCOUNTER — Encounter: Payer: Self-pay | Admitting: Gastroenterology

## 2018-04-24 VITALS — BP 146/84 | HR 96 | Ht 69.0 in | Wt 233.4 lb

## 2018-04-24 DIAGNOSIS — K581 Irritable bowel syndrome with constipation: Secondary | ICD-10-CM

## 2018-04-24 DIAGNOSIS — K4090 Unilateral inguinal hernia, without obstruction or gangrene, not specified as recurrent: Secondary | ICD-10-CM

## 2018-04-24 NOTE — Progress Notes (Signed)
Wyline MoodKiran Emmani Lesueur MD, MRCP(U.K) [address]  [address]  [place], [place] [address]  Main: [phone]  Fax: [phone]   Primary Care Physician: Jerrilyn [place], [facility]  Primary Gastroenterologist:  Dr. Wyline MoodKiran Sherrica Niehaus   Chief Complaint  Patient presents with  . Follow-up    Abdominal pain    HPI: Jesse Bailey is a 22 y.o. male    Summary of history :  He was initially referred and seen back in 11/2017 for abdominal pain. He was seen in the ER on 10/21/17 for right sided abdominal pain and vomiting , similar episode in 08/2017 , the pain resolved spontaneously. He had some blood in his urine and there was concern for passing a renal stone. CT renal stone study showed only a small left inguinal hernia. LFT's,CBC and lipase showed no significant abnormality .The pain had since resolved. My impression was that was likely either from food poisoning +/- IBS-C. Diet devoid of fiber.   Interval history   11/29/2017-  04/24/2018  He was seen at the ER in 03/2018 for abdominal pain CT-scan of the abdomen showed small left inguinal hernia .Normal HB,CMP except for elevated glucose.  Since the last ER visit has had no abdominal pain. On the day he went to the ER had not had a bowel movement for a few days. No other issues.    He has a bowel movement every other day- not hard. Denies any NSAID use The pain had begun on the same day of the ER visit. Improved after a bowel movement when he left ER.   BP (!) 146/84   Pulse 96   Ht 5\' 9"  (1.753 m)   Wt 233 lb 6.4 oz (105.9 kg)   BMI 34.47 kg/m    Current Outpatient Medications  Medication Sig Dispense Refill  . ondansetron (ZOFRAN ODT) 4 MG disintegrating tablet Take 1 tablet (4 mg total) by mouth every 8 (eight) hours as needed for nausea or vomiting. 20 tablet 0   No current facility-administered medications for this visit.     Allergies as of 04/24/2018  . (No Known Allergies)    ROS:  General: Negative for anorexia, weight  loss, fever, chills, fatigue, weakness. ENT: Negative for hoarseness, difficulty swallowing , nasal congestion. CV: Negative for chest pain, angina, palpitations, dyspnea on exertion, peripheral edema.  Respiratory: Negative for dyspnea at rest, dyspnea on exertion, cough, sputum, wheezing.  GI: See history of present illness. GU:  Negative for dysuria, hematuria, urinary incontinence, urinary frequency, nocturnal urination.  Endo: Negative for unusual weight change.    Physical Examination:   BP (!) 146/84   Pulse 96   Ht 5\' 9"  (1.753 m)   Wt 233 lb 6.4 oz (105.9 kg)   BMI 34.47 kg/m   General: Well-nourished, well-developed in no acute distress.  Eyes: No icterus. Conjunctivae pink. Mouth: Oropharyngeal mucosa moist and pink , no lesions erythema or exudate. Lungs: Clear to auscultation bilaterally. Non-labored. Heart: Regular rate and rhythm, no murmurs rubs or gallops.  Abdomen: Bowel sounds are normal, nontender, nondistended, no hepatosplenomegaly or masses, no abdominal bruits or hernia , no rebound or guarding.   Extremities: No lower extremity edema. No clubbing or deformities. Neuro: Alert and oriented x 3.  Grossly intact. Skin: Warm and dry, no jaundice.   Psych: Alert and cooperative, normal mood and affect.   Imaging Studies: Ct Abdomen Pelvis W Contrast  Result Date: 04/04/2018 CLINICAL DATA:  Left-sided abdominal pain in starting last night,  with vomiting. EXAM: CT ABDOMEN AND PELVIS WITH CONTRAST TECHNIQUE: Multidetector CT imaging of the abdomen and pelvis was performed using the standard protocol following bolus administration of intravenous contrast. CONTRAST:  ISOVUE-300 IOPAMIDOL (ISOVUE-300) INJECTION 61% COMPARISON:  CT abdomen dated 10/21/2017. FINDINGS: Lower chest: No acute abnormality. Hepatobiliary: No focal liver abnormality is seen. No gallstones, gallbladder wall thickening, or biliary dilatation. Pancreas: Unremarkable. No pancreatic ductal  dilatation or surrounding inflammatory changes. Spleen: Normal in size without focal abnormality. Adrenals/Urinary Tract: Adrenal glands appear normal. Kidneys appear normal without mass, stone or hydronephrosis. No perinephric inflammation. No ureteral or bladder calculi identified. Bladder is unremarkable, partially decompressed. Stomach/Bowel: No dilated large or small bowel loops. No evidence of bowel wall inflammation seen. Appendix is normal. Stomach is unremarkable, partially decompressed. Vascular/Lymphatic: No significant vascular findings are present. No enlarged abdominal or pelvic lymph nodes. Reproductive: Prostate is unremarkable. Other: No free fluid or abscess collection. No free intraperitoneal air. Small LEFT inguinal hernia which contains fat only. Musculoskeletal: No acute or significant osseous findings. IMPRESSION: 1. No acute findings within the abdomen or pelvis. No bowel obstruction or evidence of bowel wall inflammation. No renal or ureteral calculi. 2. Stable small LEFT inguinal hernia which contains fat only. Electronically Signed   By: Bary Richard M.D.   On: 04/04/2018 09:37    Assessment and Plan:   [name] is a 22 y.o. y/o male here to follow up for abdominal pain likely secondary to IBS-C , low fiber in diet . Also has left inguinal hernia on CT scan .  Plan  1. Refer to Dr Everlene Farrier in surgery for inguinal hernia 2. High fiber diet - counseled and given patient information .Miralax as needed   Dr Wyline Mood  MD,MRCP [facility]) Follow up in PRN

## 2018-04-24 NOTE — Patient Instructions (Signed)

## 2018-05-15 ENCOUNTER — Ambulatory Visit: Payer: Self-pay | Admitting: Surgery

## 2018-05-27 ENCOUNTER — Encounter: Payer: Self-pay | Admitting: Surgery

## 2018-05-27 ENCOUNTER — Ambulatory Visit (INDEPENDENT_AMBULATORY_CARE_PROVIDER_SITE_OTHER): Payer: BLUE CROSS/BLUE SHIELD | Admitting: Surgery

## 2018-05-27 ENCOUNTER — Other Ambulatory Visit: Payer: Self-pay

## 2018-05-27 VITALS — BP 151/100 | HR 103 | Temp 98.2°F | Ht 69.0 in | Wt 233.4 lb

## 2018-05-27 DIAGNOSIS — K4090 Unilateral inguinal hernia, without obstruction or gangrene, not specified as recurrent: Secondary | ICD-10-CM

## 2018-05-27 NOTE — Patient Instructions (Addendum)
Patient is to return to the office in 6 months.   Call the office with any questions or concerns.   Inguinal Hernia, Adult An inguinal hernia is when fat or your intestines push through a weak spot in a muscle where your leg meets your lower belly (groin). This causes a rounded lump (bulge). This kind of hernia could also be:  In your scrotum, if you are male.  In folds of skin around your vagina, if you are male. There are three types of inguinal hernias. These include:  Hernias that can be pushed back into the belly (are reducible). This type rarely causes pain.  Hernias that cannot be pushed back into the belly (are incarcerated).  Hernias that cannot be pushed back into the belly and lose their blood supply (are strangulated). This type needs emergency surgery. If you do not have symptoms, you may not need treatment. If you have symptoms or a large hernia, you may need surgery. Follow these instructions at home: Lifestyle  Do these things if told by your doctor so you do not have trouble pooping (constipation): ? Drink enough fluid to keep your pee (urine) pale yellow. ? Eat foods that have a lot of fiber. These include fresh fruits and vegetables, whole grains, and beans. ? Limit foods that are high in fat and processed sugars. These include foods that are fried or sweet. ? Take medicine for trouble pooping.  Avoid lifting heavy objects.  Avoid standing for long amounts of time.  Do not use any products that contain nicotine or tobacco. These include cigarettes and e-cigarettes. If you need help quitting, ask your doctor.  Stay at a healthy weight. General instructions  You may try to push your hernia in by very gently pressing on it when you are lying down. Do not try to force the bulge back in if it will not push in easily.  Watch your hernia for any changes in shape, size, or color. Tell your doctor if you see any changes.  Take over-the-counter and prescription  medicines only as told by your doctor.  Keep all follow-up visits as told by your doctor. This is important. Contact a doctor if:  You have a fever.  You have new symptoms.  Your symptoms get worse. Get help right away if:  The area where your leg meets your lower belly has: ? Pain that gets worse suddenly. ? A bulge that gets bigger suddenly, and it does not get smaller after that. ? A bulge that turns red or purple. ? A bulge that is painful when you touch it.  You are a man, and your scrotum: ? Suddenly feels painful. ? Suddenly changes in size.  You cannot push the hernia in by very gently pressing on it when you are lying down. Do not try to force the bulge back in if it will not push in easily.  You feel sick to your stomach (nauseous), and that feeling does not go away.  You throw up (vomit), and that keeps happening.  You have a fast heartbeat.  You cannot poop (have a bowel movement) or pass gas. These symptoms may be an emergency. Do not wait to see if the symptoms will go away. Get medical help right away. Call your local emergency services (911 in the [place] Summary  An inguinal hernia is when fat or your intestines push through a weak spot in a muscle where your leg meets your lower belly (groin). This causes a rounded lump (  bulge).  If you do not have symptoms, you may not need treatment. If you have symptoms or a large hernia, you may need surgery.  Avoid lifting heavy objects. Also avoid standing for long amounts of time.  Do not try to force the bulge back in if it will not push in easily. This information is not intended to replace advice given to you by your health care provider. Make sure you discuss any questions you have with your health care provider. Document Released: 05/25/2006 Document Revised: 05/26/2017 Document Reviewed: 01/24/2017 Elsevier Interactive Patient Education  2019 [employer].

## 2018-05-28 ENCOUNTER — Encounter: Payer: Self-pay | Admitting: Surgery

## 2018-05-28 NOTE — Progress Notes (Signed)
Patient ID: Jesse Bailey, male   DOB: December 26, 1995, 23 y.o.   MRN: [id number]  HPI DEMARIAE PANAGOPOULOS is a 23 y.o. male seen in consultation at the request of Dr. Tobi Bastos for a left inguinal hernia.  Patient had history of left lower quadrant pain that prompted visit to the emergency room as well as a visit to Dr. Tobi Bastos from GI. He  Did have some right-sided dull moderate lower abdominal pain with some nausea and vomiting.  There is question about IBS.  Part of the work-up including a CT scan showing evidence of a left inguinal hernia no other abnormality.  CBC and CMP were normal.  Lipase was normal.  He does have some anxiety and depression.  No previous abdominal operations.  He is able to perform more than 6 METS of activity without any shortness of breath or chest pain.  No weight loss no fevers no chills  HPI  Past Medical History:  Diagnosis Date  . Anxiety   . Depression     Past Surgical History:  Procedure Laterality Date  . TYMPANOSTOMY TUBE PLACEMENT      Family History  Problem Relation Age of Onset  . Hypertension Mother   . Hyperlipidemia Father     Social History Social History   Tobacco Use  . Smoking status: Never Smoker  . Smokeless tobacco: Never Used  Substance Use Topics  . Alcohol use: No  . Drug use: No    No Known Allergies  Current Outpatient Medications  Medication Sig Dispense Refill  . ondansetron (ZOFRAN ODT) 4 MG disintegrating tablet Take 1 tablet (4 mg total) by mouth every 8 (eight) hours as needed for nausea or vomiting. 20 tablet 0   No current facility-administered medications for this visit.      Review of Systems Full ROS  was asked and was negative except for the information on the HPI  Physical Exam Blood pressure (!) 151/100, pulse (!) 103, temperature 98.2 F (36.8 C), temperature source Temporal, height 5\' 9"  (1.753 m), weight 233 lb 6.4 oz (105.9 kg), SpO2 96 %. CONSTITUTIONAL: NAD. EYES: Pupils are equal, round, and reactive to  light, Sclera are non-icteric. EARS, NOSE, MOUTH AND THROAT: The oropharynx is clear. The oral mucosa is pink and moist. Hearing is intact to voice. LYMPH NODES:  Lymph nodes in the neck are normal. RESPIRATORY:  Lungs are clear. There is normal respiratory effort, with equal breath sounds bilaterally, and without pathologic use of accessory muscles. CARDIOVASCULAR: Heart is regular without murmurs, gallops, or rubs. GI: The abdomen is  soft, nontender, and nondistended. There are no palpable masses. There is no hepatosplenomegaly. There are normal bowel sounds in all quadrants. Reducible Left inguinal hernia GU: Rectal deferred.   MUSCULOSKELETAL: Normal muscle strength and tone. No cyanosis or edema.   SKIN: Turgor is good and there are no pathologic skin lesions or ulcers. NEUROLOGIC: Motor and sensation is grossly normal. Cranial nerves are grossly intact. PSYCH:  Oriented to person, place and time. Affect is normal.  Data Reviewed  I have personally reviewed the patient's imaging, laboratory findings and medical records.    Assessment/Plan Left inguinal hernia difficult to establish whether his abdominal pain was related to the inguinal hernia but his symptoms have resolved now.  I had a lengthy discussion with patient and the family about his disease process.  He is young and there is definitely an left inguinal hernia.  I offer the option of repair but currently he wishes  to wait.  He wants to see me back in about 6 months.  Discussed with him in detail about potential alarming signs including pain nausea vomiting and the chance of incarceration or strangulation.  He is fully aware of the risk.  There is no need for any emergent surgical intervention at this time.  A copy of this report was sent to the referring provider    Sterling Big, MD FACS General Surgeon 05/28/2018, 9:33 AM

## 2018-09-17 ENCOUNTER — Other Ambulatory Visit: Payer: Self-pay

## 2018-09-17 ENCOUNTER — Emergency Department: Payer: BLUE CROSS/BLUE SHIELD

## 2018-09-17 ENCOUNTER — Emergency Department
Admission: EM | Admit: 2018-09-17 | Discharge: 2018-09-17 | Disposition: A | Discharge status: Home / Self Care | Payer: BLUE CROSS/BLUE SHIELD | Attending: Emergency Medicine | Admitting: Emergency Medicine

## 2018-09-17 DIAGNOSIS — R1032 Left lower quadrant pain: Secondary | ICD-10-CM | POA: Insufficient documentation

## 2018-09-17 DIAGNOSIS — R1110 Vomiting, unspecified: Secondary | ICD-10-CM | POA: Diagnosis not present

## 2018-09-17 DIAGNOSIS — R319 Hematuria, unspecified: Secondary | ICD-10-CM | POA: Diagnosis not present

## 2018-09-17 DIAGNOSIS — R109 Unspecified abdominal pain: Secondary | ICD-10-CM

## 2018-09-17 LAB — URINALYSIS, COMPLETE (UACMP) WITH MICROSCOPIC
Bilirubin Urine: NEGATIVE
Glucose, UA: NEGATIVE mg/dL
Ketones, ur: NEGATIVE mg/dL
Leukocytes,Ua: NEGATIVE
Nitrite: NEGATIVE
Protein, ur: 30 mg/dL — AB
RBC / HPF: >50 RBC/hpf — ABNORMAL HIGH (ref 0–5)
Specific Gravity, Urine: 1.013 (ref 1.005–1.030)
pH: 7 (ref 5.0–8.0)

## 2018-09-17 LAB — BASIC METABOLIC PANEL
Anion gap: 11 (ref 5–15)
BUN: 14 mg/dL (ref 6–20)
CO2: 24 mmol/L (ref 22–32)
Calcium: 9.3 mg/dL (ref 8.9–10.3)
Chloride: 106 mmol/L (ref 98–111)
Creatinine, Ser: 1.05 mg/dL (ref 0.61–1.24)
GFR calc Af Amer: >60 mL/min (ref >60)
GFR calc non Af Amer: >60 mL/min (ref >60)
Glucose, Bld: 129 mg/dL — ABNORMAL HIGH (ref 70–99)
Potassium: 3.4 mmol/L — ABNORMAL LOW (ref 3.5–5.1)
Sodium: 141 mmol/L (ref 135–145)

## 2018-09-17 LAB — CBC
HCT: 45.2 % (ref 39.0–52.0)
Hemoglobin: 15.5 g/dL (ref 13.0–17.0)
MCH: 29.2 pg (ref 26.0–34.0)
MCHC: 34.3 g/dL (ref 30.0–36.0)
MCV: 85.3 fL (ref 80.0–100.0)
Platelets: 226 10*3/uL (ref 150–400)
RBC: 5.3 MIL/uL (ref 4.22–5.81)
RDW: 12 % (ref 11.5–15.5)
WBC: 7.6 10*3/uL (ref 4.0–10.5)
nRBC: 0 % (ref 0.0–0.2)

## 2018-09-17 MED ORDER — TAMSULOSIN HCL 0.4 MG PO CAPS: 0.4000 mg | ORAL_CAPSULE | Freq: Every day | ORAL | 0 refills | Status: DC

## 2018-09-17 MED ORDER — NAPROXEN 500 MG PO TABS: 500.0000 mg | ORAL_TABLET | Freq: Two times a day (BID) | ORAL | 0 refills | Status: DC

## 2018-09-17 MED ORDER — SODIUM CHLORIDE 0.9 % IV BOLUS
1000.0000 mL | Freq: Once | INTRAVENOUS | Status: AC
  Administered 2018-09-17: 1000 mL via INTRAVENOUS

## 2018-09-17 MED ORDER — FENTANYL CITRATE (PF) 100 MCG/2ML IJ SOLN
50.0000 ug | Freq: Once | INTRAMUSCULAR | Status: AC
  Administered 2018-09-17: 50 ug via INTRAVENOUS
  Filled 2018-09-17: qty 2

## 2018-09-17 MED ORDER — ONDANSETRON HCL 4 MG/2ML IJ SOLN
4.0000 mg | Freq: Once | INTRAMUSCULAR | Status: AC
  Administered 2018-09-17: 10:00:00 4 mg via INTRAVENOUS
  Filled 2018-09-17: qty 2

## 2018-09-17 MED ORDER — ONDANSETRON HCL 4 MG PO TABS: 4.0000 mg | ORAL_TABLET | Freq: Four times a day (QID) | ORAL | 1 refills | Status: DC | PRN

## 2018-09-17 NOTE — Discharge Instructions (Signed)
Your xray today was okay.  Your labs show blood in the urine, but no signs of infection or serious dehydration.  Your symptoms are most likely due to a kidney infection. Please follow up with your doctor in about a week for repeat urine testing to ensure that your symptoms and blood in the urine have resolved.

## 2018-09-17 NOTE — ED Notes (Signed)
Patient denies pain and is resting comfortably.  

## 2018-09-17 NOTE — ED Triage Notes (Signed)
Pt c/o left abd/flank pain that started this morning with N/V, states he has had this pain intermittently over the past year and dx with a hernia. Pt is diaphoretic in triage, vomiting.

## 2018-09-17 NOTE — ED Notes (Signed)
Pt denies nausea but had an episode of vomiting

## 2018-09-17 NOTE — ED Provider Notes (Signed)
[facility] Emergency Department Provider Note  ____________________________________________  Time seen: Approximately 1:26 PM  I have reviewed the triage vital signs and the nursing notes.   HISTORY  Chief Complaint Flank Pain    HPI Jesse Bailey is a 23 y.o. male with past history of anxiety and depression who complains of left lower quadrant abdominal pain that started hurting this morning, constant, waxing waning, nonradiating, no aggravating or alleviating factors.  Normal bowel movement yesterday.  No nausea vomiting today.  Never had pain like this before but reports a history of hernia.  Denies any bulging or sudden onset.      Past Medical History:  Diagnosis Date  . Anxiety   . Depression      Patient Active Problem List   Diagnosis Date Noted  . Moderate major depression, single episode (HCC) 12/21/2016  . Social anxiety disorder 12/21/2016     Past Surgical History:  Procedure Laterality Date  . TYMPANOSTOMY TUBE PLACEMENT       Prior to Admission medications   Medication Sig Start Date End Date Taking? Authorizing Provider  naproxen (NAPROSYN) 500 MG tablet Take 1 tablet (500 mg total) by mouth 2 (two) times daily with a meal. 09/17/18   Sharman Cheek, MD  ondansetron (ZOFRAN) 4 MG tablet Take 1 tablet (4 mg total) by mouth every 6 (six) hours as needed for nausea or vomiting. 09/17/18   Sharman Cheek, MD  tamsulosin (FLOMAX) 0.4 MG CAPS capsule Take 1 capsule (0.4 mg total) by mouth daily. 09/17/18   Sharman Cheek, MD     Allergies Patient has no known allergies.   Family History  Problem Relation Age of Onset  . Hypertension Mother   . Hyperlipidemia Father     Social History Social History   Tobacco Use  . Smoking status: Never Smoker  . Smokeless tobacco: Never Used  Substance Use Topics  . Alcohol use: No  . Drug use: No    Review of Systems  Constitutional:   No fever or chills.  ENT:   No  sore throat. No rhinorrhea. Cardiovascular:   No chest pain or syncope. Respiratory:   No dyspnea or cough. Gastrointestinal: Positive as above for abdominal pain without vomiting and diarrhea.  Musculoskeletal:   Negative for focal pain or swelling All other systems reviewed and are negative except as documented above in ROS and HPI.  ____________________________________________   PHYSICAL EXAM:  VITAL SIGNS: ED Triage Vitals  Enc Vitals Group     BP 09/17/18 0927 139/90     Pulse Rate 09/17/18 0927 88     Resp 09/17/18 0927 17     Temp 09/17/18 0927 97.8 F (36.6 C)     Temp Source 09/17/18 0927 Oral     SpO2 09/17/18 0927 96 %     Weight 09/17/18 0928 225 lb (102.1 kg)     Height 09/17/18 0928 5\' 10"  (1.778 m)     Head Circumference --      Peak Flow --      Pain Score 09/17/18 0927 6     Pain Loc --      Pain Edu? --      Excl. in GC? --     Vital signs reviewed, nursing assessments reviewed.   Constitutional:   Alert and oriented. Non-toxic appearance. Eyes:   Conjunctivae are normal. EOMI. PERRL. ENT      Head:   Normocephalic and atraumatic.      Nose:  No congestion/rhinnorhea.       Mouth/Throat:   MMM, no pharyngeal erythema. No peritonsillar mass.       Neck:   No meningismus. Full ROM. Hematological/Lymphatic/Immunilogical:   No cervical or inguinal lymphadenopathy. Cardiovascular:   RRR. Symmetric bilateral radial and DP pulses.  No murmurs. Cap refill less than 2 seconds. Respiratory:   Normal respiratory effort without tachypnea/retractions. Breath sounds are clear and equal bilaterally. No wheezes/rales/rhonchi. Gastrointestinal:   Soft with minimal left lower quadrant tenderness.  No bulging or hernia.. Non distended. There is no CVA tenderness.  No rebound, rigidity, or guarding.  Musculoskeletal:   Normal range of motion in all extremities. No joint effusions.  No lower extremity tenderness.  No edema. Neurologic:   Normal speech and language.   Motor grossly intact. No acute focal neurologic deficits are appreciated.  Skin:    Skin is warm, dry and intact. No rash noted.  No petechiae, purpura, or bullae.  ____________________________________________    LABS (pertinent positives/negatives) (all labs ordered are listed, but only abnormal results are displayed) Labs Reviewed  URINALYSIS, COMPLETE (UACMP) WITH MICROSCOPIC - Abnormal; Notable for the following components:      Result Value   Color, Urine YELLOW (*)    APPearance HAZY (*)    Hgb urine dipstick LARGE (*)    Protein, ur 30 (*)    RBC / HPF >50 (*)    Bacteria, UA RARE (*)    All other components within normal limits  BASIC METABOLIC PANEL - Abnormal; Notable for the following components:   Potassium 3.4 (*)    Glucose, Bld 129 (*)    All other components within normal limits  URINE CULTURE  CBC   ____________________________________________   EKG    ____________________________________________    RADIOLOGY  Dg Abdomen Acute W/chest  Result Date: 09/17/2018 CLINICAL DATA:  Nausea vomiting for 1 day with left lower quadrant pain, initial encounter EXAM: DG ABDOMEN ACUTE W/ 1V CHEST COMPARISON:  04/04/2018 FINDINGS: Cardiac shadows within normal limits. The lungs are clear bilaterally. No focal infiltrate or sizable effusion is noted. Scattered large and small bowel gas is noted. No abnormal mass or abnormal calcifications are noted. No acute bony abnormality is seen. IMPRESSION: No acute abnormality noted. Electronically Signed   By: Alcide Clever M.D.   On: 09/17/2018 11:40    ____________________________________________   PROCEDURES Procedures  ____________________________________________  DIFFERENTIAL DIAGNOSIS   Colitis, renal colic, UTI, constipation, gas pain  CLINICAL IMPRESSION / ASSESSMENT AND PLAN / ED COURSE  Medications ordered in the ED: Medications  ondansetron (ZOFRAN) injection 4 mg (4 mg Intravenous Given 09/17/18 0951)   fentaNYL (SUBLIMAZE) injection 50 mcg (50 mcg Intravenous Given 09/17/18 0951)  sodium chloride 0.9 % bolus 1,000 mL (1,000 mLs Intravenous New Bag/Given 09/17/18 5284)    Pertinent labs & imaging results that were available during my care of the patient were reviewed by me and considered in my medical decision making (see chart for details).  Jesse Bailey was evaluated in Emergency Department on 09/17/2018 for the symptoms described in the history of present illness. He was evaluated in the context of the global COVID-19 pandemic, which necessitated consideration that the patient might be at risk for infection with the SARS-CoV-2 virus that causes COVID-19. Institutional protocols and algorithms that pertain to the evaluation of patients at risk for COVID-19 are in a state of rapid change based on information released by regulatory bodies including the CDC and federal and state organizations.  These policies and algorithms were followed during the patient's care in the ED.   Patient is nontoxic, presents with normal vital signs, reassuring exam.  Labs are all unremarkable, x-ray is unremarkable and nonobstructive.  Urinalysis shows hematuria.  Presentation is consistent with renal colic, will treat with NSAIDs Zofran Flomax, can follow-up with primary care in a week for repeat urine testing.      ____________________________________________   FINAL CLINICAL IMPRESSION(S) / ED DIAGNOSES    Final diagnoses:  Left flank pain  Hematuria, unspecified type     ED Discharge Orders         Ordered    naproxen (NAPROSYN) 500 MG tablet  2 times daily with meals     09/17/18 1324    ondansetron (ZOFRAN) 4 MG tablet  Every 6 hours PRN     09/17/18 1324    tamsulosin (FLOMAX) 0.4 MG CAPS capsule  Daily     09/17/18 1324          Portions of this note were generated with dragon dictation software. Dictation errors may occur despite best attempts at proofreading.   Sharman CheekStafford, Priscella Donna,  MD 09/17/18 1327

## 2018-09-17 NOTE — ED Notes (Signed)
Pt states his left side started to hurt this morning- last bm yesterday

## 2018-09-18 LAB — URINE CULTURE: Culture: NO GROWTH

## 2018-11-01 ENCOUNTER — Encounter: Payer: Self-pay | Admitting: Emergency Medicine

## 2018-11-01 ENCOUNTER — Emergency Department
Admission: EM | Admit: 2018-11-01 | Discharge: 2018-11-01 | Disposition: A | Discharge status: Home / Self Care | Payer: BC Managed Care – PPO | Attending: Emergency Medicine | Admitting: Emergency Medicine

## 2018-11-01 ENCOUNTER — Other Ambulatory Visit: Payer: Self-pay

## 2018-11-01 DIAGNOSIS — Z5321 Procedure and treatment not carried out due to patient leaving prior to being seen by health care provider: Secondary | ICD-10-CM | POA: Diagnosis not present

## 2018-11-01 DIAGNOSIS — R109 Unspecified abdominal pain: Secondary | ICD-10-CM | POA: Insufficient documentation

## 2018-11-01 HISTORY — DX: Irritable bowel syndrome, unspecified: K58.9

## 2018-11-01 LAB — LIPASE, BLOOD: Lipase: 23 U/L (ref 11–51)

## 2018-11-01 LAB — COMPREHENSIVE METABOLIC PANEL
ALT: 25 U/L (ref 0–44)
AST: 19 U/L (ref 15–41)
Albumin: 4.7 g/dL (ref 3.5–5.0)
Alkaline Phosphatase: 93 U/L (ref 38–126)
Anion gap: 7 (ref 5–15)
BUN: 15 mg/dL (ref 6–20)
CO2: 23 mmol/L (ref 22–32)
Calcium: 9.3 mg/dL (ref 8.9–10.3)
Chloride: 109 mmol/L (ref 98–111)
Creatinine, Ser: 1.15 mg/dL (ref 0.61–1.24)
GFR calc Af Amer: >60 mL/min (ref >60)
GFR calc non Af Amer: >60 mL/min (ref >60)
Glucose, Bld: 90 mg/dL (ref 70–99)
Potassium: 3.8 mmol/L (ref 3.5–5.1)
Sodium: 139 mmol/L (ref 135–145)
Total Bilirubin: 0.7 mg/dL (ref 0.3–1.2)
Total Protein: 7.3 g/dL (ref 6.5–8.1)

## 2018-11-01 LAB — CBC
HCT: 46.4 % (ref 39.0–52.0)
Hemoglobin: 15.4 g/dL (ref 13.0–17.0)
MCH: 29.2 pg (ref 26.0–34.0)
MCHC: 33.2 g/dL (ref 30.0–36.0)
MCV: 88 fL (ref 80.0–100.0)
Platelets: 214 10*3/uL (ref 150–400)
RBC: 5.27 MIL/uL (ref 4.22–5.81)
RDW: 12 % (ref 11.5–15.5)
WBC: 7.3 10*3/uL (ref 4.0–10.5)
nRBC: 0 % (ref 0.0–0.2)

## 2018-11-01 MED ORDER — SODIUM CHLORIDE 0.9% FLUSH: 3.0000 mL | Freq: Once | INTRAVENOUS | Status: DC

## 2018-11-01 NOTE — ED Notes (Signed)
Patient's mother informed registration personnel, prior to 1900 that patient intended to leave. Patient not in lobby.

## 2018-11-01 NOTE — ED Triage Notes (Signed)
Pt to ER with c/o RLQ abdominal pain that started two hours PTA and n/v that started after arrival to ER.  Pt denies radiation of pain or urinary symptoms at this time.

## 2018-11-01 NOTE — ED Notes (Signed)
First Nurse Note: Pt c/o RLQ abd pain x 2 hours and N/V. Pt took a Naproxen without relief. Pt is in NAD

## 2018-11-04 ENCOUNTER — Telehealth: Payer: Self-pay | Admitting: Emergency Medicine

## 2018-11-04 NOTE — Telephone Encounter (Signed)
Called patient due to lwot to inquire about condition and follow up plans. No answer and VM full

## 2018-11-25 ENCOUNTER — Ambulatory Visit: Payer: BLUE CROSS/BLUE SHIELD | Admitting: Surgery

## 2018-11-27 ENCOUNTER — Encounter: Payer: Self-pay | Admitting: Surgery

## 2018-11-27 ENCOUNTER — Other Ambulatory Visit: Payer: Self-pay

## 2018-11-27 ENCOUNTER — Ambulatory Visit (INDEPENDENT_AMBULATORY_CARE_PROVIDER_SITE_OTHER): Payer: BC Managed Care – PPO | Admitting: Surgery

## 2018-11-27 VITALS — BP 122/78 | HR 90 | Temp 97.7°F | Resp 14 | Ht 70.0 in | Wt 219.0 lb

## 2018-11-27 DIAGNOSIS — K4090 Unilateral inguinal hernia, without obstruction or gangrene, not specified as recurrent: Secondary | ICD-10-CM

## 2018-11-27 NOTE — Patient Instructions (Addendum)

## 2018-12-02 ENCOUNTER — Encounter: Payer: Self-pay | Admitting: Surgery

## 2018-12-02 NOTE — Progress Notes (Signed)
Jesse Bailey is a 23 year old male well-known to me with a history of left inguinal hernia.  It has benn a few months ago since I saw him and he was symptomatic at that time.  He reports that currently he is symptom-free.  Had an extensive discussion with patient regarding natural disease and natural history of inguinal hernias.  At this point time she wishes not to do any surgery.  On physical exam the hernia is reducible and no evidence of incarceration or strangulation. I have discussed with him and the family in detail about the risk, benefits and possible complications of surgery as well as watchful waiting.  They wish to call us back if he develops any problems. I spent at least 25 minutes in this encounter with greater than 50% spent in coordination and counseling of his care

## 2020-11-30 ENCOUNTER — Emergency Department
Admission: EM | Admit: 2020-11-30 | Discharge: 2020-11-30 | Disposition: A | Discharge status: Home / Self Care | Payer: BC Managed Care – PPO | Attending: Emergency Medicine | Admitting: Emergency Medicine

## 2020-11-30 ENCOUNTER — Other Ambulatory Visit: Payer: Self-pay

## 2020-11-30 ENCOUNTER — Emergency Department: Payer: BC Managed Care – PPO

## 2020-11-30 ENCOUNTER — Encounter: Payer: Self-pay | Admitting: *Deleted

## 2020-11-30 DIAGNOSIS — N201 Calculus of ureter: Secondary | ICD-10-CM | POA: Insufficient documentation

## 2020-11-30 DIAGNOSIS — N211 Calculus in urethra: Secondary | ICD-10-CM

## 2020-11-30 DIAGNOSIS — R112 Nausea with vomiting, unspecified: Secondary | ICD-10-CM | POA: Diagnosis present

## 2020-11-30 LAB — URINALYSIS, COMPLETE (UACMP) WITH MICROSCOPIC
Bilirubin Urine: NEGATIVE
Glucose, UA: NEGATIVE mg/dL
Ketones, ur: 5 mg/dL — AB
Leukocytes,Ua: NEGATIVE
Nitrite: NEGATIVE
Protein, ur: 30 mg/dL — AB
Specific Gravity, Urine: 1.031 — ABNORMAL HIGH (ref 1.005–1.030)
pH: 5 (ref 5.0–8.0)

## 2020-11-30 LAB — COMPREHENSIVE METABOLIC PANEL
ALT: 43 U/L (ref 0–44)
AST: 25 U/L (ref 15–41)
Albumin: 4.7 g/dL (ref 3.5–5.0)
Alkaline Phosphatase: 58 U/L (ref 38–126)
Anion gap: 10 (ref 5–15)
BUN: 17 mg/dL (ref 6–20)
CO2: 22 mmol/L (ref 22–32)
Calcium: 9.4 mg/dL (ref 8.9–10.3)
Chloride: 107 mmol/L (ref 98–111)
Creatinine, Ser: 1.48 mg/dL — ABNORMAL HIGH (ref 0.61–1.24)
GFR, Estimated: >60 mL/min (ref >60)
Glucose, Bld: 129 mg/dL — ABNORMAL HIGH (ref 70–99)
Potassium: 3.7 mmol/L (ref 3.5–5.1)
Sodium: 139 mmol/L (ref 135–145)
Total Bilirubin: 0.8 mg/dL (ref 0.3–1.2)
Total Protein: 7.8 g/dL (ref 6.5–8.1)

## 2020-11-30 LAB — CBC
HCT: 45.8 % (ref 39.0–52.0)
Hemoglobin: 16.2 g/dL (ref 13.0–17.0)
MCH: 30 pg (ref 26.0–34.0)
MCHC: 35.4 g/dL (ref 30.0–36.0)
MCV: 84.8 fL (ref 80.0–100.0)
Platelets: 248 10*3/uL (ref 150–400)
RBC: 5.4 MIL/uL (ref 4.22–5.81)
RDW: 12.3 % (ref 11.5–15.5)
WBC: 10.7 10*3/uL — ABNORMAL HIGH (ref 4.0–10.5)
nRBC: 0 % (ref 0.0–0.2)

## 2020-11-30 LAB — LIPASE, BLOOD: Lipase: 24 U/L (ref 11–51)

## 2020-11-30 MED ORDER — OXYCODONE-ACETAMINOPHEN 5-325 MG PO TABS: 1.0000 | ORAL_TABLET | Freq: Four times a day (QID) | ORAL | 0 refills | Status: DC | PRN

## 2020-11-30 MED ORDER — SODIUM CHLORIDE 0.9 % IV BOLUS
1000.0000 mL | Freq: Once | INTRAVENOUS | Status: AC
  Administered 2020-11-30: 1000 mL via INTRAVENOUS

## 2020-11-30 MED ORDER — ONDANSETRON HCL 4 MG/2ML IJ SOLN
4.0000 mg | Freq: Once | INTRAMUSCULAR | Status: AC
  Administered 2020-11-30: 4 mg via INTRAVENOUS
  Filled 2020-11-30: qty 2

## 2020-11-30 MED ORDER — KETOROLAC TROMETHAMINE 30 MG/ML IJ SOLN
30.0000 mg | Freq: Once | INTRAMUSCULAR | Status: AC
  Administered 2020-11-30: 30 mg via INTRAVENOUS
  Filled 2020-11-30: qty 1

## 2020-11-30 MED ORDER — ONDANSETRON 4 MG PO TBDP
ORAL_TABLET | ORAL | Status: AC
  Filled 2020-11-30: qty 1

## 2020-11-30 MED ORDER — ONDANSETRON 4 MG PO TBDP
4.0000 mg | ORAL_TABLET | Freq: Once | ORAL | Status: AC
  Administered 2020-11-30: 4 mg via ORAL

## 2020-11-30 MED ORDER — TAMSULOSIN HCL 0.4 MG PO CAPS: 0.4000 mg | ORAL_CAPSULE | Freq: Every day | ORAL | 0 refills | Status: DC

## 2020-11-30 MED ORDER — ONDANSETRON 4 MG PO TBDP: ORAL_TABLET | ORAL | 0 refills | Status: DC

## 2020-11-30 NOTE — ED Notes (Signed)
See triage note  Presents with some n/v and abd pain  Afebrile on arrival

## 2020-11-30 NOTE — ED Provider Notes (Signed)
[facility] Emergency Department Provider Note  ____________________________________________   Event Date/Time   First MD Initiated Contact with Patient 11/30/20 0719     (approximate)  I have reviewed the triage vital signs and the nursing notes.   HISTORY  Chief Complaint Abdominal Pain   HPI Jesse Bailey is a 25 y.o. male presents to the ED with complaint of left flank area pain that began yesterday.  Patient states that he also has had nausea and vomiting since yesterday and has been unable to drink fluids.  He states that all day yesterday he may have drank 8 ounces of water.  He states that in the past they have suspected kidney stones.  He was given Zofran for nausea when he first arrived to the ED.  He denies any fever, chills, urinary frequency or gross hematuria.  Currently rates his pain as a 9 out of 10.         Past Medical History:  Diagnosis Date   Anxiety    Depression    Irritable bowel     Patient Active Problem List   Diagnosis Date Noted   Moderate major depression, single episode (HCC) 12/21/2016   Social anxiety disorder 12/21/2016    Past Surgical History:  Procedure Laterality Date   TYMPANOSTOMY TUBE PLACEMENT      Prior to Admission medications   Medication Sig Start Date End Date Taking? Authorizing Provider  ondansetron (ZOFRAN ODT) 4 MG disintegrating tablet 1-2 every 8 hours prn nausea and vomiting 11/30/20  Yes Bridget Hartshorn L, PA-C  oxyCODONE-acetaminophen (PERCOCET) 5-325 MG tablet Take 1 tablet by mouth every 6 (six) hours as needed for severe pain. 11/30/20 11/30/21 Yes Tommi Rumps, PA-C  tamsulosin (FLOMAX) 0.4 MG CAPS capsule Take 1 capsule (0.4 mg total) by mouth daily. 11/30/20  Yes Bridget Hartshorn L, PA-C  ondansetron (ZOFRAN) 4 MG tablet Take 1 tablet (4 mg total) by mouth every 6 (six) hours as needed for nausea or vomiting. 09/17/18   Sharman Cheek, MD    Allergies Patient has no known  allergies.  Family History  Problem Relation Age of Onset   Hypertension Mother    Hyperlipidemia Father     Social History Social History   Tobacco Use   Smoking status: Never   Smokeless tobacco: Never  Vaping Use   Vaping Use: Never used  Substance Use Topics   Alcohol use: No   Drug use: No    Review of Systems Constitutional: No fever/chills Eyes: No visual changes. ENT: No sore throat. Cardiovascular: Denies chest pain. Respiratory: Denies shortness of breath. Gastrointestinal: Positive abdominal pain.  Positive nausea, additive vomiting.  No diarrhea.  No constipation. Genitourinary: Decreased urination Musculoskeletal: Negative for back pain. Skin: Negative for rash. Neurological: Negative for headaches, focal weakness or numbness. Psychiatric: History of major depression, single episode.  History of social anxiety disorder. ____________________________________________   PHYSICAL EXAM:  VITAL SIGNS: ED Triage Vitals  Enc Vitals Group     BP 11/30/20 0056 102/84     Pulse Rate 11/30/20 0056 95     Resp 11/30/20 0056 18     Temp 11/30/20 0056 98.3 F (36.8 C)     Temp Source 11/30/20 0056 Oral     SpO2 11/30/20 0056 98 %     Weight 11/30/20 0054 280 lb (127 kg)     Height 11/30/20 0054 5\' 10"  (1.778 m)     Head Circumference --  Peak Flow --      Pain Score 11/30/20 0054 9     Pain Loc --      Pain Edu? --      Excl. in GC? --     Constitutional: Alert and oriented. Well appearing and in no acute distress. Eyes: Conjunctivae are normal.  Head: Atraumatic. Nose: No congestion/rhinnorhea. Mouth/Throat: Mucous membranes are moist.  Oropharynx non-erythematous. Neck: No stridor.   Cardiovascular: Normal rate, regular rhythm. Grossly normal heart sounds.  Good peripheral circulation. Respiratory: Normal respiratory effort.  No retractions. Lungs CTAB. Gastrointestinal: Soft and nontender. No distention. No abdominal bruits. No CVA  tenderness. Musculoskeletal: No lower extremity tenderness nor edema.  No joint effusions. Neurologic:  Normal speech and language. No gross focal neurologic deficits are appreciated. No gait instability. Skin:  Skin is warm, dry and intact. No rash noted. Psychiatric: Mood and affect are normal. Speech and behavior are normal.  ____________________________________________   LABS (all labs ordered are listed, but only abnormal results are displayed)  Labs Reviewed  COMPREHENSIVE METABOLIC PANEL - Abnormal; Notable for the following components:      Result Value   Glucose, Bld 129 (*)    Creatinine, Ser 1.48 (*)    All other components within normal limits  CBC - Abnormal; Notable for the following components:   WBC 10.7 (*)    All other components within normal limits  URINALYSIS, COMPLETE (UACMP) WITH MICROSCOPIC - Abnormal; Notable for the following components:   Color, Urine YELLOW (*)    APPearance HAZY (*)    Specific Gravity, Urine 1.031 (*)    Hgb urine dipstick SMALL (*)    Ketones, ur 5 (*)    Protein, ur 30 (*)    Bacteria, UA RARE (*)    All other components within normal limits  LIPASE, BLOOD   ____________________________________________  EKG   ____________________________________________  RADIOLOGY Beaulah Corin, personally viewed and evaluated these images (plain radiographs) as part of my medical decision making, as well as reviewing the written report by the radiologist.    Official radiology report(s): CT Renal Stone Study  Result Date: 11/30/2020 CLINICAL DATA:  25 year old male with left flank and pelvic pain since 0500 hours. Nausea and vomiting. EXAM: CT ABDOMEN AND PELVIS WITHOUT CONTRAST TECHNIQUE: Multidetector CT imaging of the abdomen and pelvis was performed following the standard protocol without IV contrast. COMPARISON:  CT Abdomen and Pelvis 04/04/2018, 10/21/2017. FINDINGS: Lower chest: Negative. Hepatobiliary: Negative noncontrast  liver and gallbladder. Pancreas: Negative. Spleen: Negative. Adrenals/Urinary Tract: Normal adrenal glands. Negative noncontrast right kidney and right ureter. Mild to moderate left hydronephrosis and hydroureter. No intrarenal calculus. Left ureter remains asymmetrically dilated into the pelvis where a small round 2-3 mm calculus is located just proximal to the ureteropelvic junction on coronal image 103 and series 2, image 95. Unremarkable bladder. Stomach/Bowel: Large bowel is negative aside from redundancy. Normal retrocecal appendix. Decompressed and negative terminal ileum. No dilated small bowel. Decompressed stomach and duodenum. No free air, free fluid. Vascular/Lymphatic: Normal caliber abdominal aorta. No calcified atherosclerosis. No lymphadenopathy. Reproductive: Subtle fat containing left inguinal hernia is stable. Otherwise negative. Other: No pelvic free fluid. Musculoskeletal: Stable, negative. S1 spina bifida occulta, normal variant. IMPRESSION: 1. Acute obstructive uropathy on the left with a 2-3 mm distal ureteral calculus located just proximal to the left UPJ. No other urinary calculus identified. 2. Otherwise negative noncontrast abdomen or pelvis. Electronically Signed   By: Odessa Fleming M.D.   On: 11/30/2020  08:34    ____________________________________________   PROCEDURES  Procedure(s) performed (including Critical Care):  Procedures   ____________________________________________   INITIAL IMPRESSION / ASSESSMENT AND PLAN / ED COURSE  As part of my medical decision making, I reviewed the following data within the electronic MEDICAL RECORD NUMBER Notes from prior ED visits and [place] Controlled Substance Database  ----------------------------------------- 10:28 AM on 11/30/2020 ----------------------------------------- No continued nausea or vomiting and patient pain has decreased significantly and he is able to sleep.  25 year old male presents to the ED with complaint of left  flank pain along with nausea and vomiting.  Patient has been unable to drink fluids for the last 24 hours due to his symptoms.  Patient and mother states that there is always been a question as to possible kidney stones.  CT scan confirms the patient has a 2 to 3 mm sized stone at the left UPJ.  Patient was given liter of fluids due to his poor p.o. intake, Toradol 30 mg IV, Zofran and was sleeping prior to discharge.  Patient is to follow-up with Dr. Lonna Cobb who is the urologist on-call for follow-up of his kidney stone.  Patient is strongly encouraged to increase fluids.  He is aware that medication was sent to his pharmacy to begin taking.  He is return to the emergency department if any severe worsening of his symptoms especially if there is any fever, chills or inability to take the medication. ____________________________________________   FINAL CLINICAL IMPRESSION(S) / ED DIAGNOSES  Final diagnoses:  Urethral stone     ED Discharge Orders          Ordered    ondansetron (ZOFRAN ODT) 4 MG disintegrating tablet        11/30/20 1008    oxyCODONE-acetaminophen (PERCOCET) 5-325 MG tablet  Every 6 hours PRN        11/30/20 1008    tamsulosin (FLOMAX) 0.4 MG CAPS capsule  Daily        11/30/20 1008             Note:  This document was prepared using Dragon voice recognition software and may include unintentional dictation errors.    Tommi Rumps, PA-C 11/30/20 1042    Dionne Bucy, MD 11/30/20 1601

## 2020-11-30 NOTE — ED Triage Notes (Addendum)
Pt reports left side abd pain   sx began tonight.  Pt has n/v  no diarrhea.  Pt with dry heaves in triage.  Pt alert  speech clear.

## 2020-11-30 NOTE — Discharge Instructions (Addendum)
Call make an appointment with Dr. Lonna Cobb for follow-up of your kidney stone.  Increase fluids frequently.  A prescription for medication was sent to your pharmacy.  The Zofran is for nausea and Percocet as needed for pain.  The third prescription is for Flomax which is taken daily.  Return to the emergency department if any severe worsening of your symptoms especially if you begin having fever, chills or inability to take the medication due to severe nausea and vomiting.

## 2020-12-06 NOTE — Progress Notes (Incomplete)
12/06/20 2:24 PM   Jesse Bailey [birth year] [id number]  Referring provider:  [facility] [address] [place],  [place] [address] No chief complaint on file.    HPI: Jesse Bailey is a 25 y.o.male who presents today for ER follow- up of nephrolithiasis.    Patient was presented to the ED on 11/30/2020  with complaint of left flank area pain that began 11/29/2020.  Patient stated that he also has had nausea and vomiting since yesterday and has been unable to drink fluids.  He states that all day yesterday he may have drank 8 ounces of water.  He states that in the past they have suspected kidney stones.  He was given Zofran for nausea when he first arrived to the ED.   Complete UA with microscope on 11/30/2020 showed small hgb urine dipstick, 0-5 RBC, 0-5 WBC, a rare bacteria, and 0-5 squamous epithelial cells. RUS showed acute obstructive uropathy on the left with a 2-3 mm distal ureteral calculus located just proximal to the left UPJ. No other urinary calculus identified.Otherwise negative noncontrast abdomen or pelvis.   PMH: Past Medical History:  Diagnosis Date   Anxiety    Depression    Irritable bowel     Surgical History: Past Surgical History:  Procedure Laterality Date   TYMPANOSTOMY TUBE PLACEMENT      Home Medications:  Allergies as of 12/07/2020   No Known Allergies      Medication List        Accurate as of December 06, 2020  2:24 PM. If you have any questions, ask your nurse or doctor.          ondansetron 4 MG disintegrating tablet Commonly known as: Zofran ODT 1-2 every 8 hours prn nausea and vomiting   ondansetron 4 MG tablet Commonly known as: Zofran Take 1 tablet (4 mg total) by mouth every 6 (six) hours as needed for nausea or vomiting.   oxyCODONE-acetaminophen 5-325 MG tablet Commonly known as: Percocet Take 1 tablet by mouth every 6 (six) hours as needed for severe pain.   tamsulosin 0.4 MG Caps capsule Commonly  known as: FLOMAX Take 1 capsule (0.4 mg total) by mouth daily.        Allergies: No Known Allergies  Family History: Family History  Problem Relation Age of Onset   Hypertension Mother    Hyperlipidemia Father     Social History:  reports that he has never smoked. He has never used smokeless tobacco. He reports that he does not drink alcohol and does not use drugs.   Physical Exam: There were no vitals taken for this visit.  Constitutional:  Alert and oriented, No acute distress. HEENT: [place] AT, moist mucus membranes.  Trachea midline, no masses. Cardiovascular: No clubbing, cyanosis, or edema. Respiratory: Normal respiratory effort, no increased work of breathing. GI: Abdomen is soft, nontender, nondistended, no abdominal masses GU: No CVA tenderness Lymph: No cervical or inguinal lymphadenopathy. Skin: No rashes, bruises or suspicious lesions. Neurologic: Grossly intact, no focal deficits, moving all 4 extremities. Psychiatric: Normal mood and affect.  Laboratory Data:  Lab Results  Component Value Date   CREATININE 1.48 (H) 11/30/2020    No results found for: PSA  No results found for: TESTOSTERONE  No results found for: HGBA1C  Urinalysis   Pertinent Imaging: CLINICAL DATA:  25 year old male with left flank and pelvic pain since 0500 hours. Nausea and vomiting.   EXAM: CT ABDOMEN AND PELVIS WITHOUT CONTRAST  TECHNIQUE: Multidetector CT imaging of the abdomen and pelvis was performed following the standard protocol without IV contrast.   COMPARISON:  CT Abdomen and Pelvis 04/04/2018, 10/21/2017.   FINDINGS: Lower chest: Negative.   Hepatobiliary: Negative noncontrast liver and gallbladder.   Pancreas: Negative.   Spleen: Negative.   Adrenals/Urinary Tract: Normal adrenal glands. Negative noncontrast right kidney and right ureter.   Mild to moderate left hydronephrosis and hydroureter. No intrarenal calculus. Left ureter remains  asymmetrically dilated into the pelvis where a small round 2-3 mm calculus is located just proximal to the ureteropelvic junction on coronal image 103 and series 2, image 95. Unremarkable bladder.   Stomach/Bowel: Large bowel is negative aside from redundancy. Normal retrocecal appendix. Decompressed and negative terminal ileum. No dilated small bowel. Decompressed stomach and duodenum. No free air, free fluid.   Vascular/Lymphatic: Normal caliber abdominal aorta. No calcified atherosclerosis. No lymphadenopathy.   Reproductive: Subtle fat containing left inguinal hernia is stable. Otherwise negative.   Other: No pelvic free fluid.   Musculoskeletal: Stable, negative. S1 spina bifida occulta, normal variant.   IMPRESSION: 1. Acute obstructive uropathy on the left with a 2-3 mm distal ureteral calculus located just proximal to the left UPJ. No other urinary calculus identified. 2. Otherwise negative noncontrast abdomen or pelvis.   Assessment & Plan:     No follow-ups on file.  I,Kailey Littlejohn,acting as a Neurosurgeon for Vanna Scotland, MD.,have documented all relevant documentation on the behalf of Vanna Scotland, MD,as directed by  Vanna Scotland, MD while in the presence of Vanna Scotland, MD.   [facility] [address] [place], [place] [address] [phone]

## 2020-12-07 ENCOUNTER — Ambulatory Visit: Payer: Self-pay | Admitting: Urology

## 2020-12-07 NOTE — Progress Notes (Incomplete)
12/07/20 8:47 AM   [name] April 05, 1996 [id number]  Referring provider:  [facility] [address] [place],  [place] [address] No chief complaint on file.    HPI: Jesse Bailey is a 25 y.o.male who presents today for ER follow- up of nephrolithiasis.    Patient was presented to the ED on 11/30/2020  with complaint of left flank area pain that began 11/29/2020.  Patient stated that he also has had nausea and vomiting since yesterday and has been unable to drink fluids.  He states that all day yesterday he may have drank 8 ounces of water.  He states that in the past they have suspected kidney stones.  He was given Zofran for nausea when he first arrived to the ED.   Complete UA with microscope on 11/30/2020 showed small hgb urine dipstick, 0-5 RBC, 0-5 WBC, a rare bacteria, and 0-5 squamous epithelial cells. RUS showed acute obstructive uropathy on the left with a 2-3 mm distal ureteral calculus located just proximal to the left UPJ. No other urinary calculus identified.Otherwise negative noncontrast abdomen or pelvis.   PMH: Past Medical History:  Diagnosis Date   Anxiety    Depression    Irritable bowel     Surgical History: Past Surgical History:  Procedure Laterality Date   TYMPANOSTOMY TUBE PLACEMENT      Home Medications:  Allergies as of 12/07/2020   No Known Allergies      Medication List        Accurate as of December 07, 2020  8:47 AM. If you have any questions, ask your nurse or doctor.          ondansetron 4 MG disintegrating tablet Commonly known as: Zofran ODT 1-2 every 8 hours prn nausea and vomiting   ondansetron 4 MG tablet Commonly known as: Zofran Take 1 tablet (4 mg total) by mouth every 6 (six) hours as needed for nausea or vomiting.   oxyCODONE-acetaminophen 5-325 MG tablet Commonly known as: Percocet Take 1 tablet by mouth every 6 (six) hours as needed for severe pain.   tamsulosin 0.4 MG Caps capsule Commonly  known as: FLOMAX Take 1 capsule (0.4 mg total) by mouth daily.        Allergies: No Known Allergies  Family History: Family History  Problem Relation Age of Onset   Hypertension Mother    Hyperlipidemia Father     Social History:  reports that he has never smoked. He has never used smokeless tobacco. He reports that he does not drink alcohol and does not use drugs.   Physical Exam: There were no vitals taken for this visit.  Constitutional:  Alert and oriented, No acute distress. HEENT: [place] AT, moist mucus membranes.  Trachea midline, no masses. Cardiovascular: No clubbing, cyanosis, or edema. Respiratory: Normal respiratory effort, no increased work of breathing. GI: Abdomen is soft, nontender, nondistended, no abdominal masses GU: No CVA tenderness Lymph: No cervical or inguinal lymphadenopathy. Skin: No rashes, bruises or suspicious lesions. Neurologic: Grossly intact, no focal deficits, moving all 4 extremities. Psychiatric: Normal mood and affect.  Laboratory Data:  Lab Results  Component Value Date   CREATININE 1.48 (H) 11/30/2020    No results found for: PSA  No results found for: TESTOSTERONE  No results found for: HGBA1C  Urinalysis   Pertinent Imaging: CLINICAL DATA:  25 year old male with left flank and pelvic pain since 0500 hours. Nausea and vomiting.   EXAM: CT ABDOMEN AND PELVIS WITHOUT CONTRAST  TECHNIQUE: Multidetector CT imaging of the abdomen and pelvis was performed following the standard protocol without IV contrast.   COMPARISON:  CT Abdomen and Pelvis 04/04/2018, 10/21/2017.   FINDINGS: Lower chest: Negative.   Hepatobiliary: Negative noncontrast liver and gallbladder.   Pancreas: Negative.   Spleen: Negative.   Adrenals/Urinary Tract: Normal adrenal glands. Negative noncontrast right kidney and right ureter.   Mild to moderate left hydronephrosis and hydroureter. No intrarenal calculus. Left ureter remains  asymmetrically dilated into the pelvis where a small round 2-3 mm calculus is located just proximal to the ureteropelvic junction on coronal image 103 and series 2, image 95. Unremarkable bladder.   Stomach/Bowel: Large bowel is negative aside from redundancy. Normal retrocecal appendix. Decompressed and negative terminal ileum. No dilated small bowel. Decompressed stomach and duodenum. No free air, free fluid.   Vascular/Lymphatic: Normal caliber abdominal aorta. No calcified atherosclerosis. No lymphadenopathy.   Reproductive: Subtle fat containing left inguinal hernia is stable. Otherwise negative.   Other: No pelvic free fluid.   Musculoskeletal: Stable, negative. S1 spina bifida occulta, normal variant.   IMPRESSION: 1. Acute obstructive uropathy on the left with a 2-3 mm distal ureteral calculus located just proximal to the left UPJ. No other urinary calculus identified. 2. Otherwise negative noncontrast abdomen or pelvis.   Assessment & Plan:     No follow-ups on file.  I,Kailey Littlejohn,acting as a Neurosurgeon for Vanna Scotland, MD.,have documented all relevant documentation on the behalf of Vanna Scotland, MD,as directed by  Vanna Scotland, MD while in the presence of Vanna Scotland, MD.   [facility] [address] [place], [place] [address] [phone]

## 2020-12-07 NOTE — Progress Notes (Signed)
12/09/20 10:13 AM   Jesse Bailey 1995-08-24 [id number]  Referring provider:  [facility] [address] [place],  [place] [address] Chief Complaint  Patient presents with   Nephrolithiasis     HPI: Jesse Bailey is a 25 y.o.male who presents today for ER follow- up of nephrolithiasis.   Patient was presented to the ED on 11/30/2020  with complaint of left flank area pain that began 11/29/2020.  Patient stated that he also has had nausea and vomiting since 11/29/2020 and has been unable to drink fluids.  He stated that he may have drank 8 ounces of water.  He stated that in the past they have suspected kidney stones.  He was given Zofran for nausea when he first arrived to the ED.   Complete UA with microscope on 11/30/2020 showed small hgb urine dipstick, 0-5 RBC, 0-5 WBC, a rare bacteria, and 0-5 squamous epithelial cells. RUS showed acute obstructive uropathy on the left with a 2-3 mm distal ureteral calculus located just proximal to the left UPJ. No other urinary calculus identified.Otherwise negative noncontrast abdomen or pelvis.   Patient is accompanied by his mother.   Patient states he has been feeling less pain. He does not think he has passed the stone he has been checking after urination to see if stone has passed.   No previous stones.   PMH: Past Medical History:  Diagnosis Date   Anxiety    Depression    Irritable bowel     Surgical History: Past Surgical History:  Procedure Laterality Date   TYMPANOSTOMY TUBE PLACEMENT      Home Medications:  Allergies as of 12/08/2020   No Known Allergies      Medication List        Accurate as of December 08, 2020 11:59 PM. If you have any questions, ask your nurse or doctor.          STOP taking these medications    ondansetron 4 MG tablet Commonly known as: Zofran Stopped by: Vanna Scotland, MD   oxyCODONE-acetaminophen 5-325 MG tablet Commonly known as: Percocet Stopped by: Vanna Scotland, MD       TAKE these medications    buPROPion 150 MG 24 hr tablet Commonly known as: WELLBUTRIN XL Take by mouth.   citalopram 40 MG tablet Commonly known as: CELEXA Take by mouth.   ibuprofen 200 MG tablet Commonly known as: ADVIL Take 200 mg by mouth every 6 (six) hours as needed.   ondansetron 4 MG disintegrating tablet Commonly known as: Zofran ODT 1-2 every 8 hours prn nausea and vomiting   tamsulosin 0.4 MG Caps capsule Commonly known as: FLOMAX Take 1 capsule (0.4 mg total) by mouth daily.        Allergies: No Known Allergies  Family History: Family History  Problem Relation Age of Onset   Hypertension Mother    Hyperlipidemia Father     Social History:  reports that he has never smoked. He has never used smokeless tobacco. He reports that he does not drink alcohol and does not use drugs.   Physical Exam: BP 139/80   Pulse (!) 102   Ht 5\' 10"  (1.778 m)   Wt 277 lb (125.6 kg)   BMI 39.75 kg/m   Constitutional:  Alert and oriented, No acute distress. HEENT: [place] AT, moist mucus membranes.  Trachea midline, no masses. Cardiovascular: No clubbing, cyanosis, or edema. Respiratory: Normal respiratory effort, no increased work of breathing. Skin:  No rashes, bruises or suspicious lesions. Neurologic: Grossly intact, no focal deficits, moving all 4 extremities. Psychiatric: Normal mood and affect.  Laboratory Data: Lab Results  Component Value Date   CREATININE 1.48 (H) 11/30/2020    Pertinent Imaging: CLINICAL DATA:  25 year old male with left flank and pelvic pain  since 0500 hours. Nausea and vomiting.     EXAM:  CT ABDOMEN AND PELVIS WITHOUT CONTRAST     TECHNIQUE:  Multidetector CT imaging of the abdomen and pelvis was performed  following the standard protocol without IV contrast.     COMPARISON:  CT Abdomen and Pelvis 04/04/2018, 10/21/2017.     FINDINGS:  Lower chest: Negative.     Hepatobiliary: Negative noncontrast liver  and gallbladder.     Pancreas: Negative.     Spleen: Negative.     Adrenals/Urinary Tract: Normal adrenal glands. Negative noncontrast  right kidney and right ureter.     Mild to moderate left hydronephrosis and hydroureter. No intrarenal  calculus. Left ureter remains asymmetrically dilated into the pelvis  where a small round 2-3 mm calculus is located just proximal to the  ureteropelvic junction on coronal image 103 and series 2, image 95.  Unremarkable bladder.     Stomach/Bowel: Large bowel is negative aside from redundancy. Normal  retrocecal appendix. Decompressed and negative terminal ileum. No  dilated small bowel. Decompressed stomach and duodenum. No free air,  free fluid.     Vascular/Lymphatic: Normal caliber abdominal aorta. No calcified  atherosclerosis. No lymphadenopathy.     Reproductive: Subtle fat containing left inguinal hernia is stable.  Otherwise negative.     Other: No pelvic free fluid.     Musculoskeletal: Stable, negative. S1 spina bifida occulta, normal  variant.     IMPRESSION:  1. Acute obstructive uropathy on the left with a 2-3 mm distal  ureteral calculus located just proximal to the left UPJ. No other  urinary calculus identified.  2. Otherwise negative noncontrast abdomen or pelvis.    CT scan images personally reviewed, agree with radiological interpretation  Assessment & Plan:    Left ureteral calculi  Small distal left ureteral stone, minimally symptomatic  We discussed various treatment options for urolithiasis including observation with or without medical expulsive therapy, shockwave lithotripsy (SWL), ureteroscopy and laser lithotripsy with stent placement, and percutaneous nephrolithotomy.   We discussed that management is based on stone size, location, density, patient co-morbidities, and patient preference.    Stones <33mm in size have a >80% spontaneous passage rate. Data surrounding the use of tamsulosin for medical  expulsive therapy is controversial, but meta analyses suggests it is most efficacious for distal stones between 5-71mm in size. Possible side effects include dizziness/lightheadedness, and retrograde ejaculation.   SWL has a lower stone free rate in a single procedure, but also a lower complication rate compared to ureteroscopy and avoids a stent and associated stent related symptoms. Possible complications include renal hematoma, steinstrasse, and need for additional treatment. We discussed the role of his increased skin to stone distance can lead to decreased efficacy with shockwave lithotripsy.   Ureteroscopy with laser lithotripsy and stent placement has a higher stone free rate than SWL in a single procedure, however increased complication rate including possible infection, ureteral injury, bleeding, and stent related morbidity. Common stent related symptoms include dysuria, urgency/frequency, and flank pain.   Given the fairly small size and proximity to his bladder, he like to continue medical expulsive therapy.  We will reevaluate again in 2 weeks.  If he passes stone in the interim, will cancel his follow-up.  We discussed general stone prevention techniques including drinking plenty water with goal of producing 2.5 L urine daily, increased citric acid intake, avoidance of high oxalate containing foods, and decreased salt intake.  Information about dietary recommendations given today.     Follow up in 2 weeks for KUB if stone has not passed.    Tawni Millers as a scribe for Vanna Scotland, MD.,have documented all relevant documentation on the behalf of Vanna Scotland, MD,as directed by  Vanna Scotland, MD while in the presence of Vanna Scotland, MD.  Vanna Scotland, MD   [facility] [address], [address] [place], [place] [address] [phone]  I spent 46 total minutes on the day of the encounter including pre-visit review of the medical record,  face-to-face time with the patient, and post visit ordering of labs/imaging/tests.

## 2020-12-08 ENCOUNTER — Encounter: Payer: Self-pay | Admitting: Urology

## 2020-12-08 ENCOUNTER — Other Ambulatory Visit: Payer: Self-pay

## 2020-12-08 ENCOUNTER — Ambulatory Visit (INDEPENDENT_AMBULATORY_CARE_PROVIDER_SITE_OTHER): Payer: BC Managed Care – PPO | Admitting: Urology

## 2020-12-08 VITALS — BP 139/80 | HR 102 | Ht 70.0 in | Wt 277.0 lb

## 2020-12-08 DIAGNOSIS — N200 Calculus of kidney: Secondary | ICD-10-CM | POA: Diagnosis not present

## 2020-12-08 LAB — MICROSCOPIC EXAMINATION
Bacteria, UA: NONE SEEN
Epithelial Cells (non renal): NONE SEEN /hpf (ref 0–10)

## 2020-12-08 LAB — URINALYSIS, COMPLETE
Bilirubin, UA: NEGATIVE
Glucose, UA: NEGATIVE
Ketones, UA: NEGATIVE
Leukocytes,UA: NEGATIVE
Nitrite, UA: NEGATIVE
Protein,UA: NEGATIVE
Specific Gravity, UA: 1.025 (ref 1.005–1.030)
Urobilinogen, Ur: 1 mg/dL (ref 0.2–1.0)
pH, UA: 5.5 (ref 5.0–7.5)

## 2020-12-20 ENCOUNTER — Ambulatory Visit
Admission: RE | Admit: 2020-12-20 | Discharge: 2020-12-20 | Disposition: A | Discharge status: Home / Self Care | Payer: BC Managed Care – PPO | Attending: Urology | Admitting: Urology

## 2020-12-20 ENCOUNTER — Ambulatory Visit
Admission: RE | Admit: 2020-12-20 | Discharge: 2020-12-20 | Disposition: A | Discharge status: Home / Self Care | Payer: BC Managed Care – PPO | Source: Ambulatory Visit | Attending: Urology | Admitting: Urology

## 2020-12-20 DIAGNOSIS — N200 Calculus of kidney: Secondary | ICD-10-CM | POA: Diagnosis present

## 2020-12-21 ENCOUNTER — Other Ambulatory Visit: Payer: Self-pay

## 2020-12-21 ENCOUNTER — Ambulatory Visit (INDEPENDENT_AMBULATORY_CARE_PROVIDER_SITE_OTHER): Payer: BC Managed Care – PPO | Admitting: Physician Assistant

## 2020-12-21 ENCOUNTER — Encounter: Payer: Self-pay | Admitting: Physician Assistant

## 2020-12-21 VITALS — BP 166/117 | HR 2 | Ht 70.0 in | Wt 277.0 lb

## 2020-12-21 DIAGNOSIS — N201 Calculus of ureter: Secondary | ICD-10-CM

## 2020-12-21 LAB — URINALYSIS, COMPLETE
Bilirubin, UA: NEGATIVE
Glucose, UA: NEGATIVE
Ketones, UA: NEGATIVE
Leukocytes,UA: NEGATIVE
Nitrite, UA: NEGATIVE
Protein,UA: NEGATIVE
RBC, UA: NEGATIVE
Specific Gravity, UA: 1.02 (ref 1.005–1.030)
Urobilinogen, Ur: 0.2 mg/dL (ref 0.2–1.0)
pH, UA: 7.5 (ref 5.0–7.5)

## 2020-12-21 LAB — MICROSCOPIC EXAMINATION: Bacteria, UA: NONE SEEN

## 2020-12-21 NOTE — Progress Notes (Signed)
12/21/2020 3:46 PM   Jesse Bailey 11-Jan-1996 [id number]  CC: Chief Complaint  Patient presents with   Nephrolithiasis    HPI: Jesse Bailey is a 25 y.o. male who presents today for follow-up of a 2 to 3 mm distal left ureteral stone on Flomax.  He is accompanied today by his mother, who contributes to HPI.  Today he reports he does not believe he has passed his stone, however his flank pain has resolved.  He has stopped Flomax but tolerated it well.  He underwent KUB yesterday was no radiopaque urolithiasis.  In-office UA and microscopy today pan negative.  PMH: Past Medical History:  Diagnosis Date   Anxiety    Depression    Irritable bowel     Surgical History: Past Surgical History:  Procedure Laterality Date   TYMPANOSTOMY TUBE PLACEMENT      Home Medications:  Allergies as of 12/21/2020   No Known Allergies      Medication List        Accurate as of December 21, 2020  3:46 PM. If you have any questions, ask your nurse or doctor.          buPROPion 150 MG 24 hr tablet Commonly known as: WELLBUTRIN XL Take by mouth.   citalopram 40 MG tablet Commonly known as: CELEXA Take by mouth.   ibuprofen 200 MG tablet Commonly known as: ADVIL Take 200 mg by mouth every 6 (six) hours as needed.   ondansetron 4 MG disintegrating tablet Commonly known as: Zofran ODT 1-2 every 8 hours prn nausea and vomiting   tamsulosin 0.4 MG Caps capsule Commonly known as: FLOMAX Take 1 capsule (0.4 mg total) by mouth daily.        Allergies:  No Known Allergies  Family History: Family History  Problem Relation Age of Onset   Hypertension Mother    Hyperlipidemia Father     Social History:   reports that he has never smoked. He has never used smokeless tobacco. He reports that he does not drink alcohol and does not use drugs.  Physical Exam: BP (!) 166/117   Pulse (!) 2   Ht 5\' 10"  (1.778 m)   Wt 277 lb (125.6 kg)   BMI 39.75 kg/m    Constitutional:  Alert and oriented, no acute distress, nontoxic appearing HEENT: [place], AT Cardiovascular: No clubbing, cyanosis, or edema Respiratory: Normal respiratory effort, no increased work of breathing Skin: No rashes, bruises or suspicious lesions Neurologic: Grossly intact, no focal deficits, moving all 4 extremities Psychiatric: Normal mood and affect  Laboratory Data: Results for orders placed or performed in visit on 12/21/20  Microscopic Examination   Urine  Result Value Ref Range   WBC, UA 0-5 0 - 5 /hpf   RBC 0-2 0 - 2 /hpf   Epithelial Cells (non renal) 0-10 0 - 10 /hpf   Mucus, UA Present (A) Not Estab.   Bacteria, UA None seen None seen/Few  Urinalysis, Complete  Result Value Ref Range   Specific Gravity, UA 1.020 1.005 - 1.030   pH, UA 7.5 5.0 - 7.5   Color, UA Yellow Yellow   Appearance Ur Hazy (A) Clear   Leukocytes,UA Negative Negative   Protein,UA Negative Negative/Trace   Glucose, UA Negative Negative   Ketones, UA Negative Negative   RBC, UA Negative Negative   Bilirubin, UA Negative Negative   Urobilinogen, Ur 0.2 0.2 - 1.0 mg/dL   Nitrite, UA Negative Negative   Microscopic Examination  See below:    Pertinent Imaging: Results for orders placed during the hospital encounter of 12/20/20  DG Abd 1 View  Narrative CLINICAL DATA:  History of kidney stone  EXAM: ABDOMEN - 1 VIEW  COMPARISON:  CT 11/30/2020  FINDINGS: The bowel gas pattern is normal. No radio-opaque calculi or other significant radiographic abnormality are seen.  IMPRESSION: Negative.   Electronically Signed By: Jasmine Pang M.D. On: 12/20/2020 21:17  I personally reviewed the images referenced above and no radiopaque stones.  Assessment & Plan:   1. Left ureteral stone Pain is resolved, KUB with no urolithiasis, and UA completely benign today.  We discussed that either he has passed the stone without awareness or he continues to have the distal left ureteral stone,  however it is not causing any bleeding and is too small to up urine KUB.  To further elucidate this I recommended a renal ultrasound to evaluate for any persistent left hydronephrosis, which would be consistent with retained stone.  Patient and mom are in agreement with this plan. - Urinalysis, Complete - US RENAL; Future   Return for Will call with results.  Carman Ching, PA-C  [facility] [address] [place], [place] [address] [phone]

## 2021-01-04 ENCOUNTER — Other Ambulatory Visit: Payer: Self-pay

## 2021-01-04 ENCOUNTER — Ambulatory Visit
Admission: RE | Admit: 2021-01-04 | Discharge: 2021-01-04 | Disposition: A | Discharge status: Home / Self Care | Payer: BC Managed Care – PPO | Source: Ambulatory Visit | Attending: Physician Assistant | Admitting: Physician Assistant

## 2021-01-04 DIAGNOSIS — N201 Calculus of ureter: Secondary | ICD-10-CM | POA: Insufficient documentation

## 2021-01-06 ENCOUNTER — Telehealth: Payer: Self-pay

## 2021-01-06 NOTE — Telephone Encounter (Signed)
-----   Message from [place], [place] sent at 01/06/2021  8:35 AM EDT ----- Normal renal ultrasound, no swelling in the kidney and both ureters are draining well into the bladder. This is all consistent with his stone having passed, great news! He can follow up as needed. ----- Message ----- From: Interface, Rad Results In Sent: 01/05/2021   1:50 AM EDT To: Carman Ching, PA-C

## 2021-01-06 NOTE — Telephone Encounter (Signed)
[facility] notifying patient of information below.

## 2023-02-26 ENCOUNTER — Encounter: Payer: Self-pay | Admitting: Emergency Medicine

## 2023-02-26 ENCOUNTER — Emergency Department: Payer: Self-pay

## 2023-02-26 ENCOUNTER — Other Ambulatory Visit: Payer: Self-pay

## 2023-02-26 ENCOUNTER — Emergency Department
Admission: EM | Admit: 2023-02-26 | Discharge: 2023-02-26 | Disposition: A | Discharge status: Home / Self Care | Payer: Self-pay | Attending: Emergency Medicine | Admitting: Emergency Medicine

## 2023-02-26 DIAGNOSIS — R109 Unspecified abdominal pain: Secondary | ICD-10-CM

## 2023-02-26 DIAGNOSIS — N200 Calculus of kidney: Secondary | ICD-10-CM | POA: Insufficient documentation

## 2023-02-26 LAB — COMPREHENSIVE METABOLIC PANEL
ALT: 49 U/L — ABNORMAL HIGH (ref 0–44)
AST: 26 U/L (ref 15–41)
Albumin: 4.9 g/dL (ref 3.5–5.0)
Alkaline Phosphatase: 81 U/L (ref 38–126)
Anion gap: 11 (ref 5–15)
BUN: 10 mg/dL (ref 6–20)
CO2: 23 mmol/L (ref 22–32)
Calcium: 9.6 mg/dL (ref 8.9–10.3)
Chloride: 107 mmol/L (ref 98–111)
Creatinine, Ser: 1.24 mg/dL (ref 0.61–1.24)
GFR, Estimated: >60 mL/min (ref >60)
Glucose, Bld: 131 mg/dL — ABNORMAL HIGH (ref 70–99)
Potassium: 4 mmol/L (ref 3.5–5.1)
Sodium: 141 mmol/L (ref 135–145)
Total Bilirubin: 0.8 mg/dL (ref 0.3–1.2)
Total Protein: 7.9 g/dL (ref 6.5–8.1)

## 2023-02-26 LAB — CBC
HCT: 46.2 % (ref 39.0–52.0)
Hemoglobin: 15.6 g/dL (ref 13.0–17.0)
MCH: 28.7 pg (ref 26.0–34.0)
MCHC: 33.8 g/dL (ref 30.0–36.0)
MCV: 85.1 fL (ref 80.0–100.0)
Platelets: 270 10*3/uL (ref 150–400)
RBC: 5.43 MIL/uL (ref 4.22–5.81)
RDW: 12.5 % (ref 11.5–15.5)
WBC: 12 10*3/uL — ABNORMAL HIGH (ref 4.0–10.5)
nRBC: 0 % (ref 0.0–0.2)

## 2023-02-26 LAB — LIPASE, BLOOD: Lipase: 24 U/L (ref 11–51)

## 2023-02-26 MED ORDER — KETOROLAC TROMETHAMINE 10 MG PO TABS
10.0000 mg | ORAL_TABLET | Freq: Four times a day (QID) | ORAL | 0 refills | Status: AC | PRN
Start: 2023-02-26 — End: ?

## 2023-02-26 MED ORDER — TAMSULOSIN HCL 0.4 MG PO CAPS: 0.4000 mg | ORAL_CAPSULE | Freq: Every day | ORAL | 0 refills | Status: AC

## 2023-02-26 MED ORDER — ONDANSETRON 4 MG PO TBDP
4.0000 mg | ORAL_TABLET | Freq: Once | ORAL | Status: AC | PRN
  Administered 2023-02-26: 4 mg via ORAL
  Filled 2023-02-26: qty 1

## 2023-02-26 MED ORDER — ONDANSETRON 8 MG PO TBDP: 8.0000 mg | ORAL_TABLET | Freq: Three times a day (TID) | ORAL | 0 refills | Status: AC | PRN

## 2023-02-26 NOTE — ED Provider Triage Note (Signed)
Emergency Medicine Provider Triage Evaluation Note  Jesse Bailey , a 27 y.o. male  was evaluated in triage.  Pt complains of left flank pain, vomiting, hx of kidney stones, no fever/chills.  Review of Systems  Positive:  Negative:   Physical Exam  There were no vitals taken for this visit. Gen:   Awake, no distress   Resp:  Normal effort  MSK:   Moves extremities without difficulty  Other:    Medical Decision Making  Medically screening exam initiated at 2:47 PM.  Appropriate orders placed.  Jesse Bailey was informed that the remainder of the evaluation will be completed by another provider, this initial triage assessment does not replace that evaluation, and the importance of remaining in the ED until their evaluation is complete.     Faythe Ghee, PA-C 02/26/23 1447

## 2023-02-26 NOTE — ED Provider Notes (Signed)
[facility] Provider Note   Event Date/Time   First MD Initiated Contact with Patient 02/26/23 1827     (approximate) History  Emesis and Abdominal Pain  HPI Jesse Bailey is a 27 y.o. male with a stated past medical history of kidney stones who presents complaining of left-sided flank pain that began approximately 12 hours prior to arrival associated with nausea/vomiting.  Patient states that this pain is similar to previous kidney stones he has had in the past. ROS: Patient currently denies any vision changes, tinnitus, difficulty speaking, facial droop, sore throat, chest pain, shortness of breath, diarrhea, dysuria, or weakness/numbness/paresthesias in any extremity   Physical Exam  Triage Vital Signs: ED Triage Vitals [02/26/23 1448]  Encounter Vitals Group     BP 123/85     Systolic BP Percentile      Diastolic BP Percentile      Pulse Rate (!) 102     Resp 18     Temp 98.4 F (36.9 C)     Temp Source Oral     SpO2 95 %     Weight 280 lb (127 kg)     Height 5\' 11"  (1.803 m)     Head Circumference      Peak Flow      Pain Score 7     Pain Loc      Pain Education      Exclude from Growth Chart    Most recent vital signs: Vitals:   02/26/23 1448 02/26/23 1847  BP: 123/85 120/78  Pulse: (!) 102 99  Resp: 18 18  Temp: 98.4 F (36.9 C)   SpO2: 95%    General: Awake, oriented x4. CV:  Good peripheral perfusion.  Resp:  Normal effort.  Abd:  No distention.  Other:  Young adult overweight Caucasian male resting comfortably in no acute distress ED Results / Procedures / Treatments  Labs (all labs ordered are listed, but only abnormal results are displayed) Labs Reviewed  COMPREHENSIVE METABOLIC PANEL - Abnormal; Notable for the following components:      Result Value   Glucose, Bld 131 (*)    ALT 49 (*)    All other components within normal limits  CBC - Abnormal; Notable for the following components:   WBC 12.0 (*)    All other  components within normal limits  LIPASE, BLOOD  URINALYSIS, ROUTINE W REFLEX MICROSCOPIC  RADIOLOGY ED MD interpretation: CT of the abdomen pelvis without IV contrast shows a 2 mm left UVJ stone with mild obstructive change -Agree with radiology assessment Official radiology report(s): No results found. PROCEDURES: Critical Care performed: No .1-3 Lead EKG Interpretation  Performed by: Merwyn Katos, MD Authorized by: Merwyn Katos, MD     Interpretation: normal     ECG rate:  71   ECG rate assessment: normal     Rhythm: sinus rhythm     Ectopy: none     Conduction: normal    MEDICATIONS ORDERED IN ED: Medications  ondansetron (ZOFRAN-ODT) disintegrating tablet 4 mg (4 mg Oral Given 02/26/23 1451)   IMPRESSION / MDM / ASSESSMENT AND PLAN / ED COURSE  I reviewed the triage vital signs and the nursing notes.                             The patient is on the cardiac monitor to evaluate for evidence of arrhythmia and/or significant heart rate  changes. Patient's presentation is most consistent with acute presentation with potential threat to life or bodily function. Patient presents for severe flank pain. Presentation most consistent with Renal Colic from a Non-infected Kidney Stone. Given History and Exam I have lower suspicion for atypical appendicitis, genital torsion, acute cholecystitis, AAA, Aortic Dissection, Serious Bacterial Illness or other emergent intraabdominal pathology.  Workup: CBC, BMP, CT Abd/Pelvis noncontrast, UA, reassess Findings: 2 mm left UVJ stone Reassesment: Patient tolerating PO and pain controlled Disposition:  Discharge. Strict return precautions for infected stone or PO intolerance discussed.   FINAL CLINICAL IMPRESSION(S) / ED DIAGNOSES   Final diagnoses:  Acute left flank pain  Kidney stone   Rx / DC Orders   ED Discharge Orders          Ordered    ondansetron (ZOFRAN-ODT) 8 MG disintegrating tablet  Every 8 hours PRN         02/26/23 1845    tamsulosin (FLOMAX) 0.4 MG CAPS capsule  Daily        02/26/23 1845    ketorolac (TORADOL) 10 MG tablet  Every 6 hours PRN       Note to Pharmacy: Patient given an IM/IV loading dose in emergency department   02/26/23 1845           Note:  This document was prepared using Dragon voice recognition software and may include unintentional dictation errors.   Merwyn Katos, MD 02/26/23 [phone]

## 2023-02-26 NOTE — ED Triage Notes (Signed)
Left side pain started this morning with vomiting. Pt states vomited 1 time. Has had kidney stones in the past and this feels the same.

## 2023-03-01 ENCOUNTER — Telehealth: Payer: Self-pay

## 2023-03-01 NOTE — Telephone Encounter (Signed)
Call pt to scheduled a ED follow-up with [facility] and [facility] prior with [facility].   LVM for pt to return call

## 2023-03-02 ENCOUNTER — Telehealth: Payer: Self-pay

## 2023-03-02 NOTE — Telephone Encounter (Signed)
3rd attempt to reach pt to schedule ED follow-up with [facility] and [facility] prior with [facility]

## 2023-03-31 IMAGING — CT CT RENAL STONE PROTOCOL
2 of 4 series · 16 of 46 positions shown, 18 images · non-contrast
Comparison: CT Abdomen and Pelvis 04/04/2018, 10/21/2017.

CLINICAL DATA: 24-year-old male with left flank and pelvic pain
since 5255 hours. Nausea and vomiting.

EXAM:
CT ABDOMEN AND PELVIS WITHOUT CONTRAST
TECHNIQUE: Multidetector CT imaging of the abdomen and pelvis was performed
following the standard protocol without IV contrast.

[Series 2: stone full standard · axial · 0.77mm/px · z∈[-992,-468]mm · 13 of 115 slices shown, 15 images]
[im 5/115  soft-tissue]
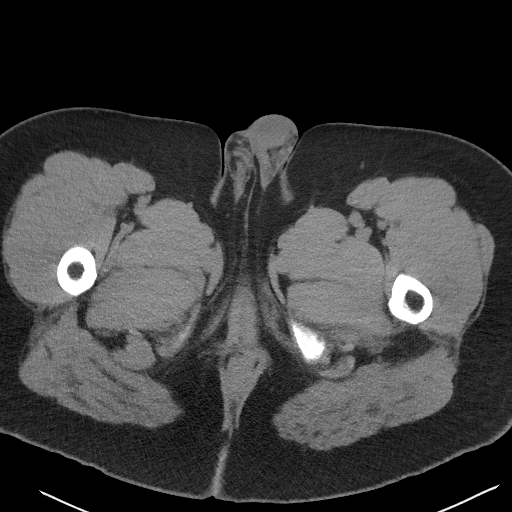
[im 5/115  bone]
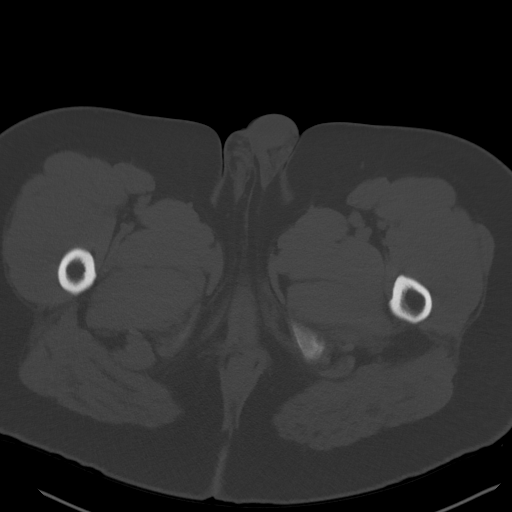
[im 15/115  soft-tissue]
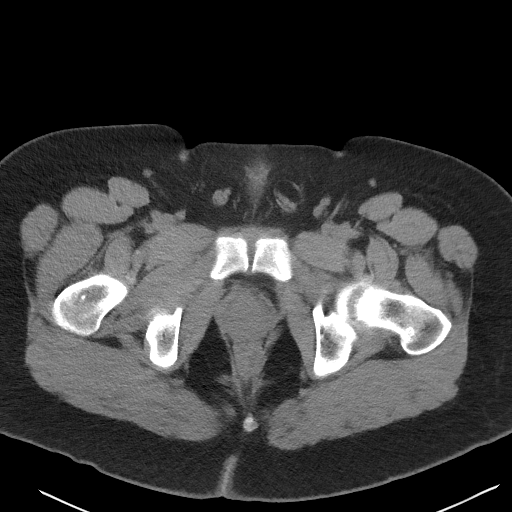
[im 24/115  soft-tissue]
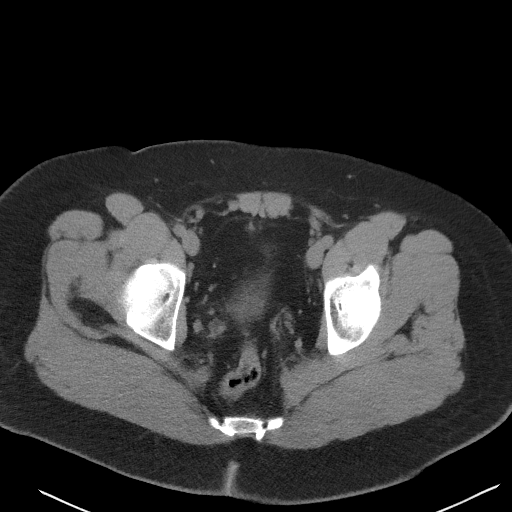
[im 34/115  soft-tissue]
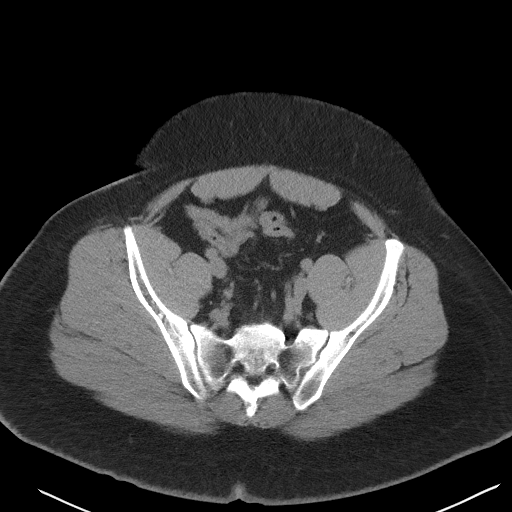
[im 39/115  soft-tissue]
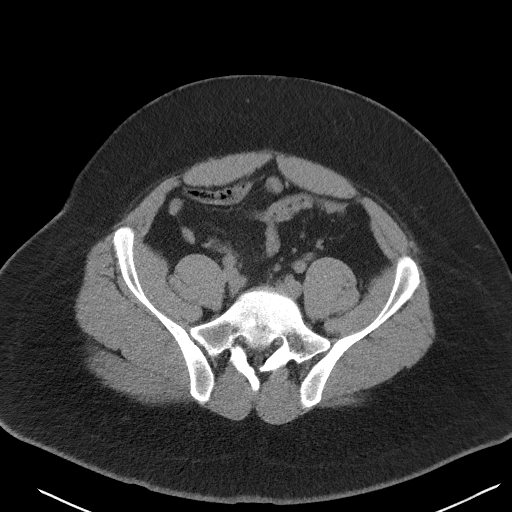
[im 48/115  soft-tissue]
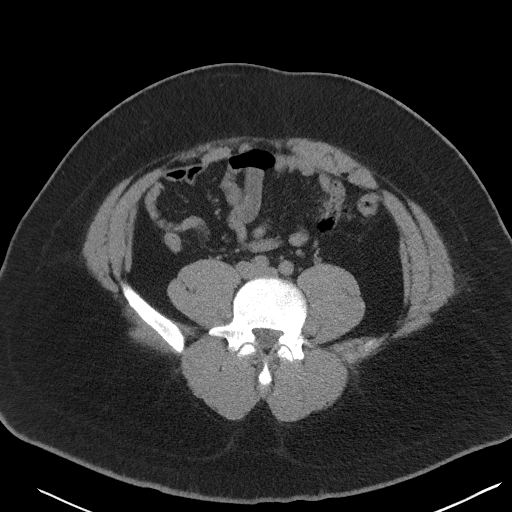
[im 58/115  soft-tissue]
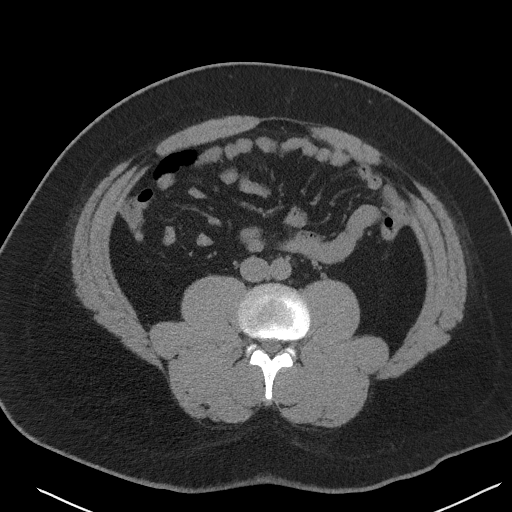
[im 67/115  soft-tissue]
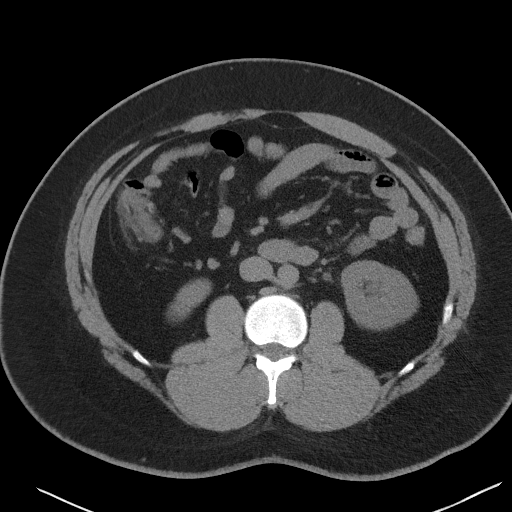
[im 77/115  soft-tissue]
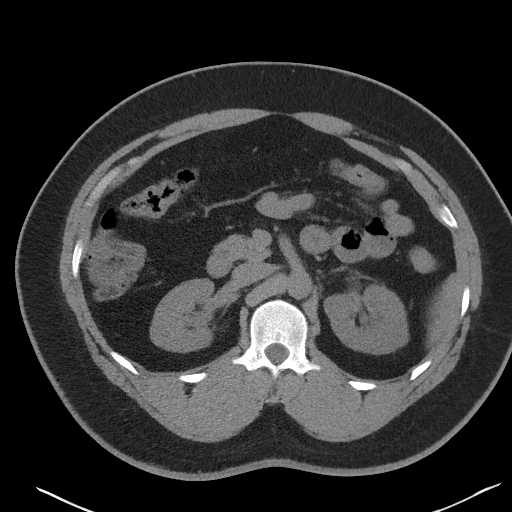
[im 77/115  bone]
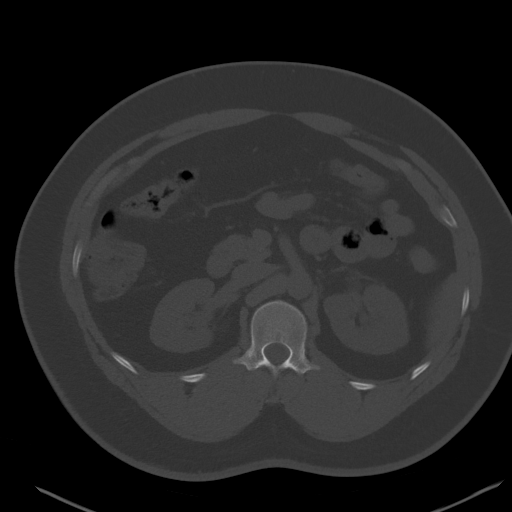
[im 81/115  soft-tissue]
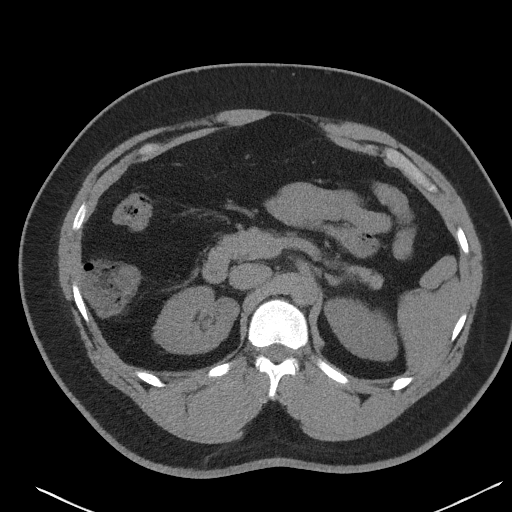
[im 91/115  soft-tissue]
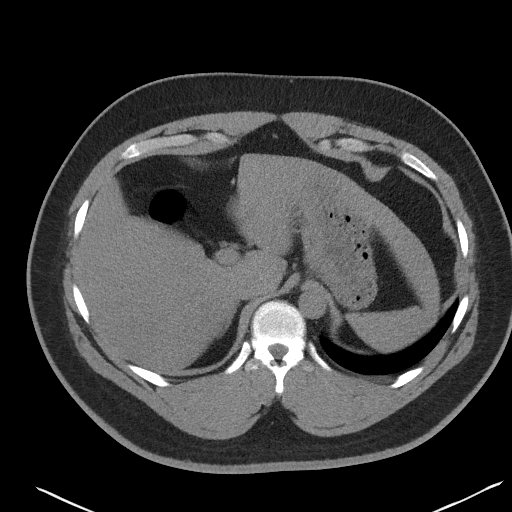
[im 100/115  soft-tissue]
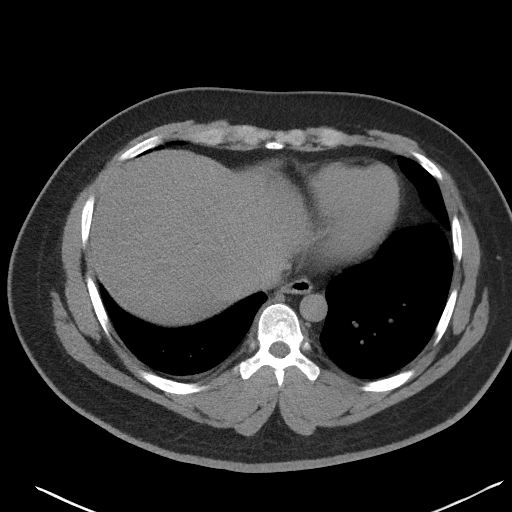
[im 110/115  soft-tissue]
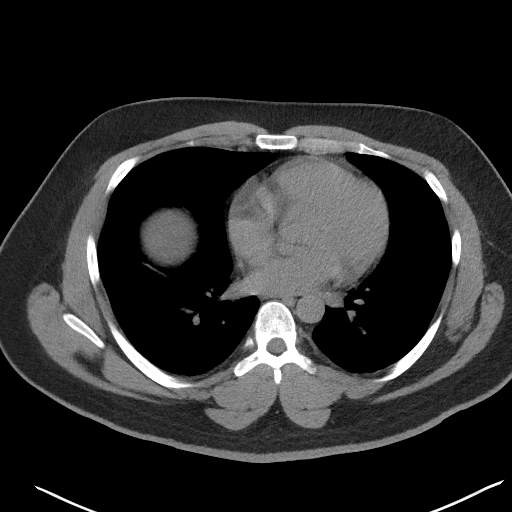

[Series 5: coronal · coronal · 0.94mm/px · 3 of 173 slices shown]
[im 58/173  soft-tissue]
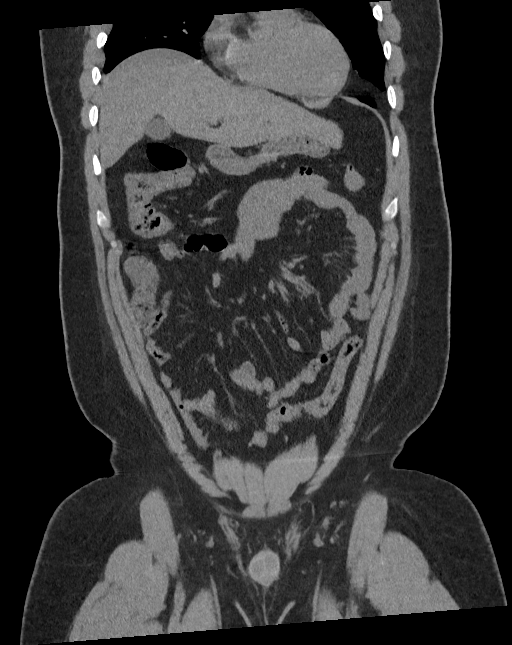
[im 77/173  soft-tissue]
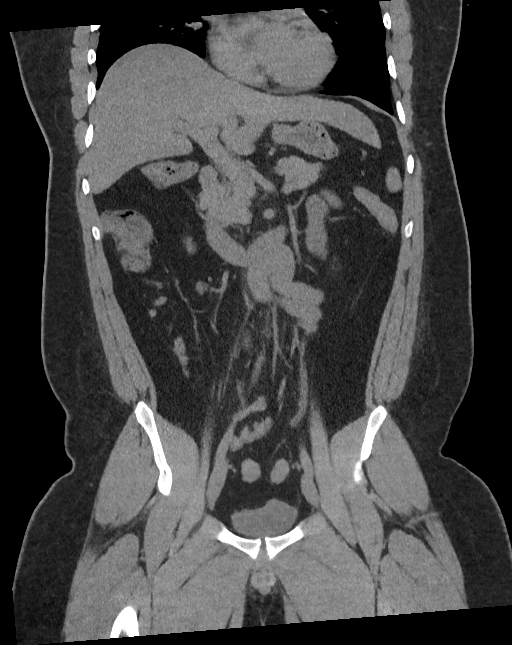
[im 96/173  soft-tissue]
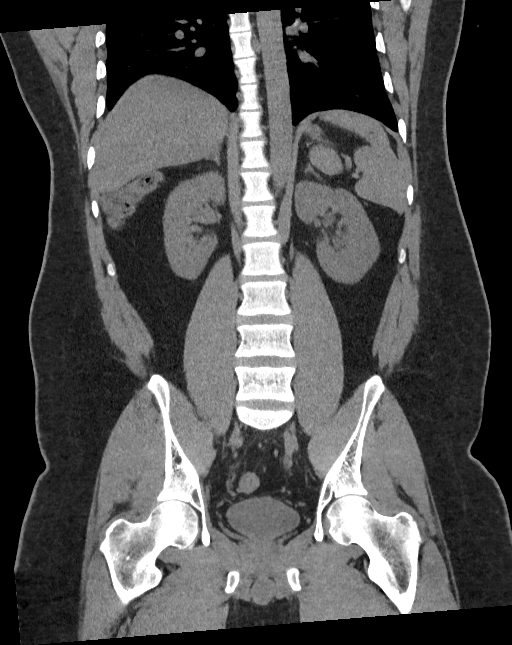

[16 of 46 positions shown; findings below may reference images not displayed]

FINDINGS: Lower chest: Negative.

Hepatobiliary: Negative noncontrast liver and gallbladder.

Pancreas: Negative.

Spleen: Negative.

Adrenals/Urinary Tract: Normal adrenal glands. Negative noncontrast
right kidney and right ureter.

Mild to moderate left hydronephrosis and hydroureter. No intrarenal
calculus. Left ureter remains asymmetrically dilated into the pelvis
where a small round 2-3 mm calculus is located just proximal to the
ureteropelvic junction on coronal image 103 and series 2, image 95.
Unremarkable bladder.

Stomach/Bowel: Large bowel is negative aside from redundancy. Normal
retrocecal appendix. Decompressed and negative terminal ileum. No
dilated small bowel. Decompressed stomach and duodenum. No free air,
free fluid.

Vascular/Lymphatic: Normal caliber abdominal aorta. No calcified
atherosclerosis. No lymphadenopathy.

Reproductive: Subtle fat containing left inguinal hernia is stable.
Otherwise negative.

Other: No pelvic free fluid.

Musculoskeletal: Stable, negative. S1 spina bifida occulta, normal
variant.
IMPRESSION: 1. Acute obstructive uropathy on the left with a 2-3 mm distal
ureteral calculus located just proximal to the left UPJ. No other
urinary calculus identified.
2. Otherwise negative noncontrast abdomen or pelvis.

## 2023-05-05 IMAGING — US US RENAL
1 series · 15 of 22 positions shown · non-contrast
Comparison: None.

CLINICAL DATA: Follow-up left ureteral stone.

EXAM:
RENAL / URINARY TRACT ULTRASOUND COMPLETE

[Series 1: us renal · 15 of 22 slices shown]
[im 1/22]
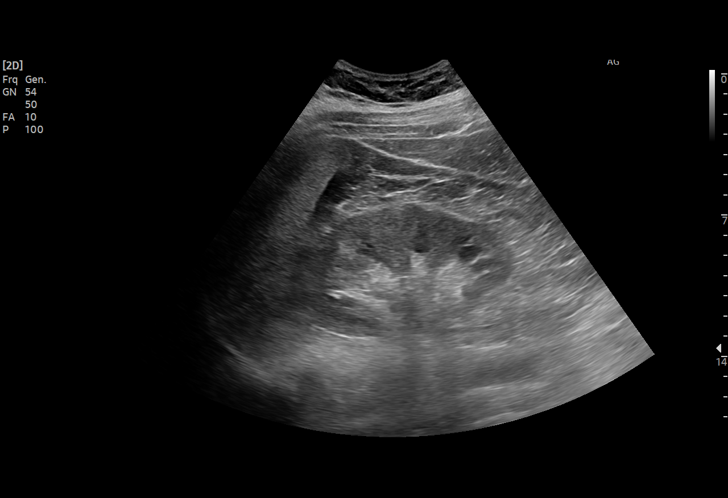
[im 3/22]
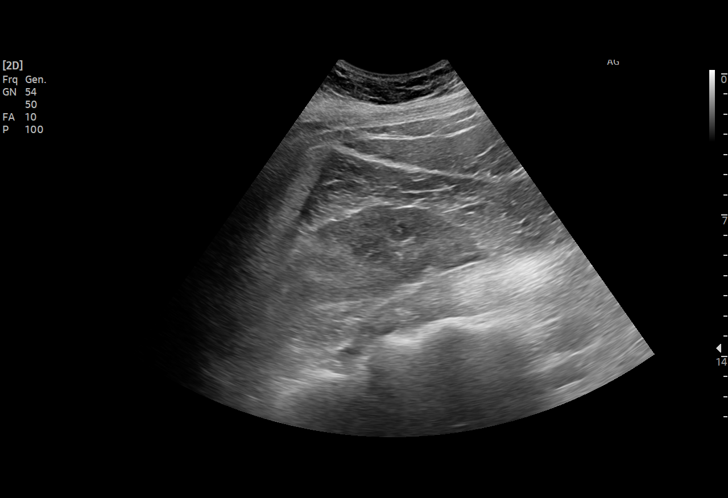
[im 4/22]
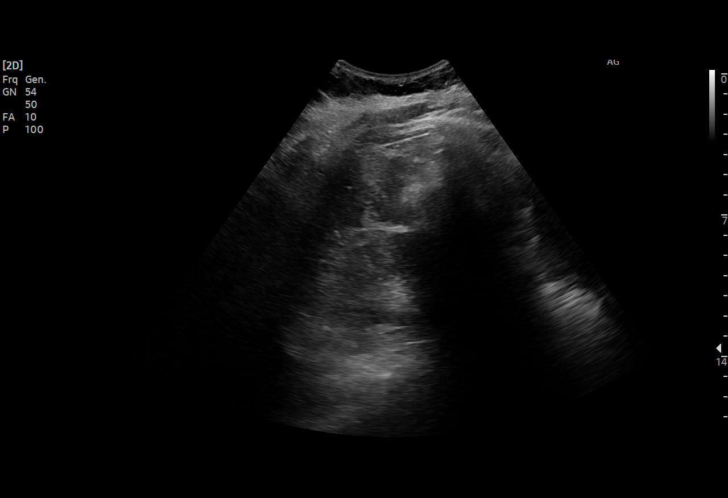
[im 6/22]
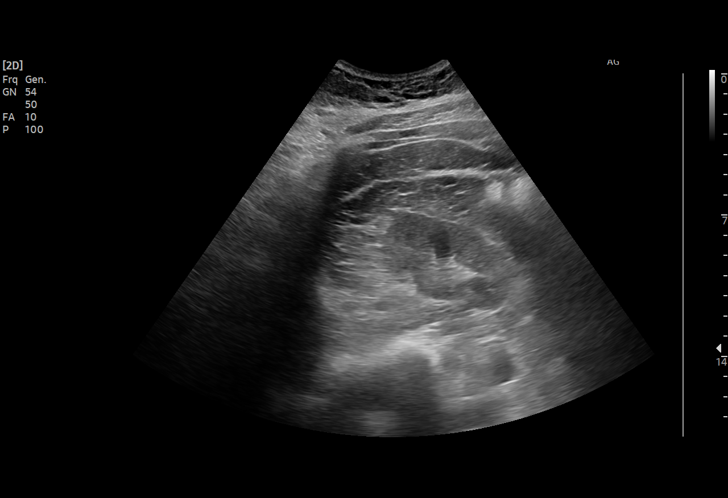
[im 7/22]
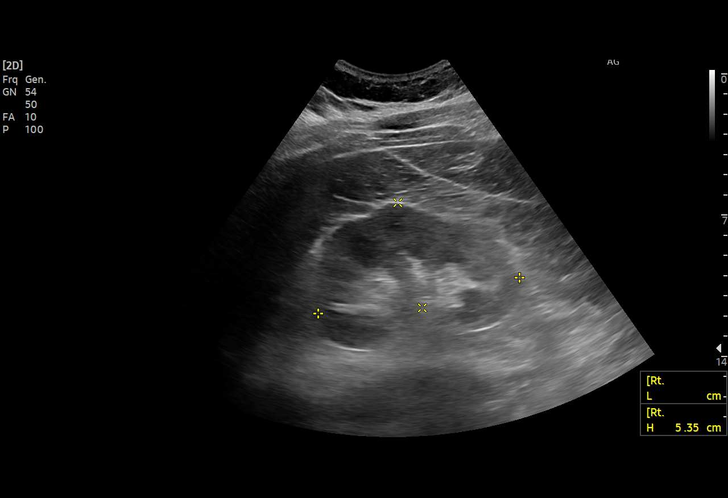
[im 9/22]
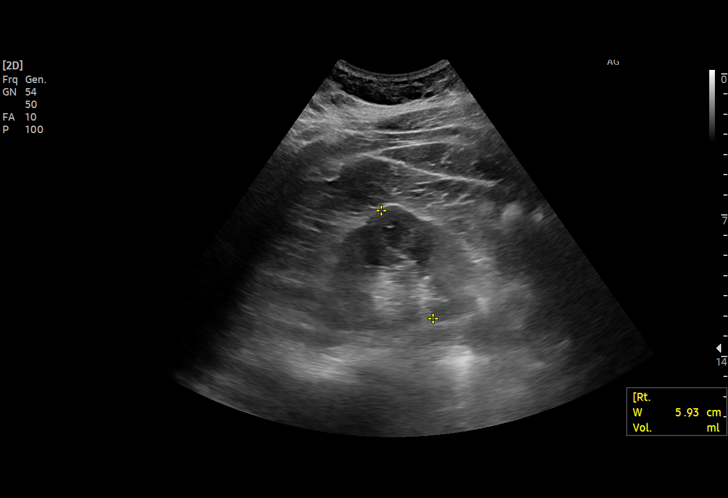
[im 10/22]
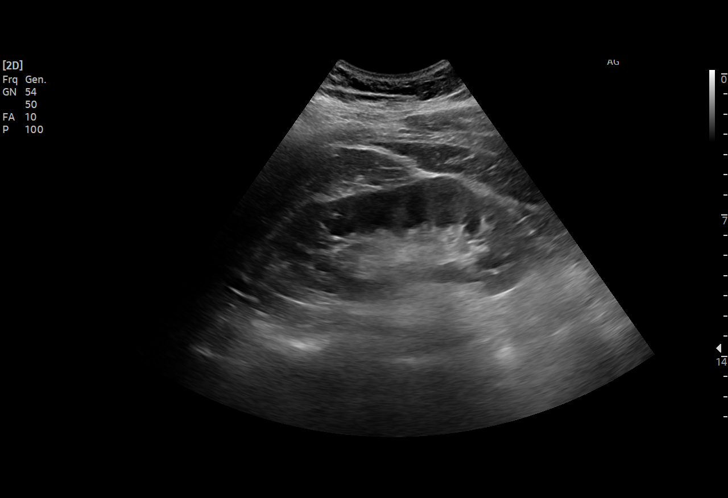
[im 12/22]
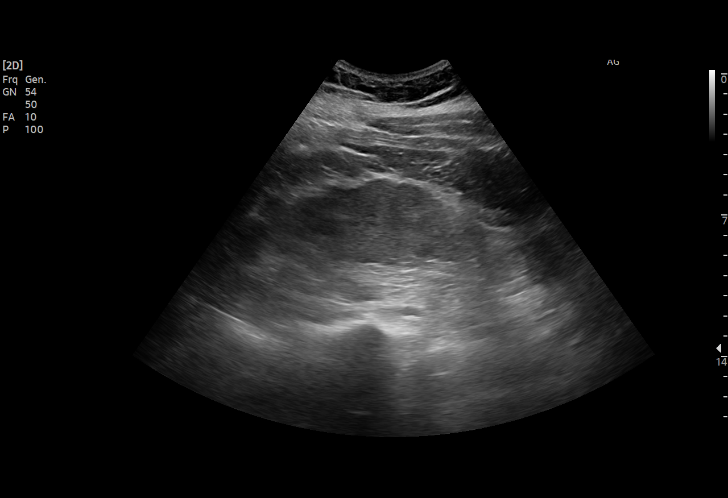
[im 13/22]
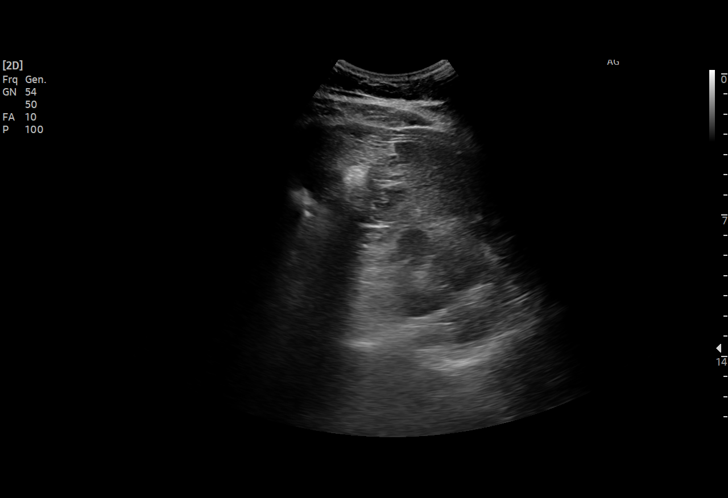
[im 14/22]
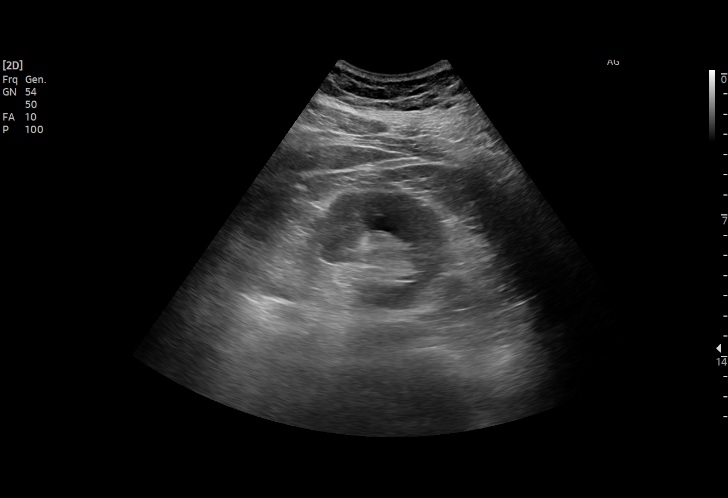
[im 16/22]
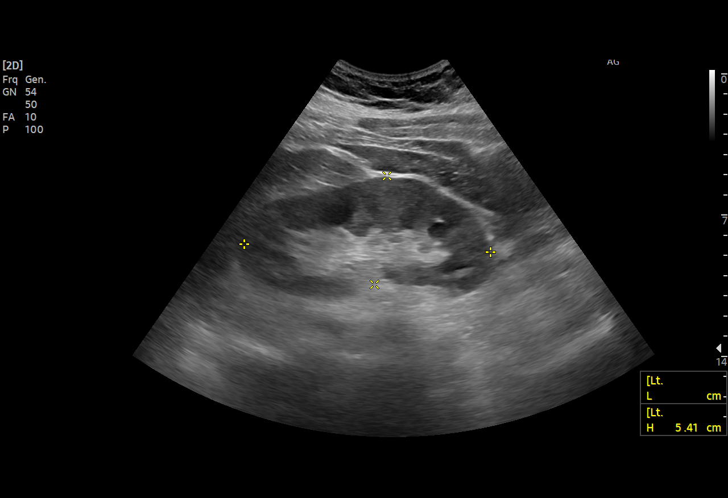
[im 17/22]
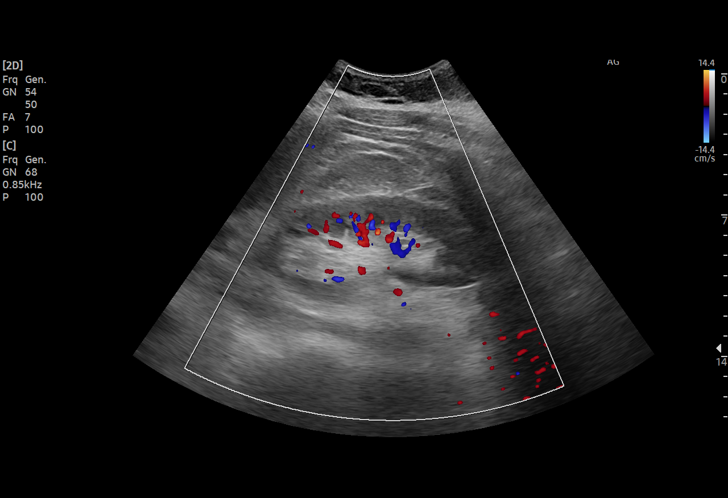
[im 19/22]
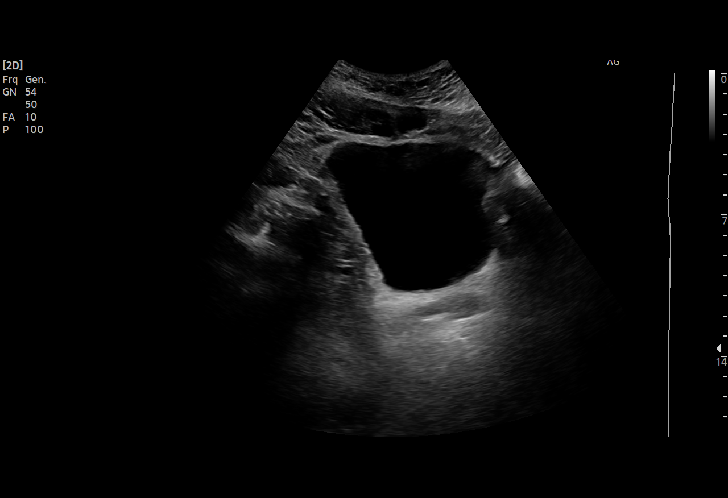
[im 20/22]
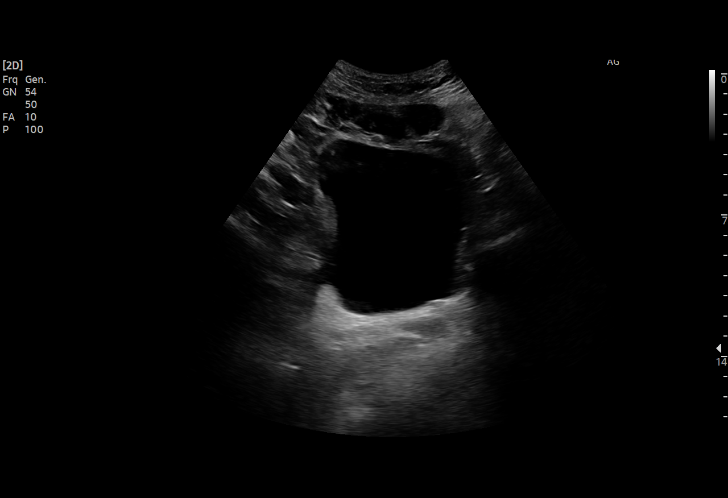
[im 22/22]
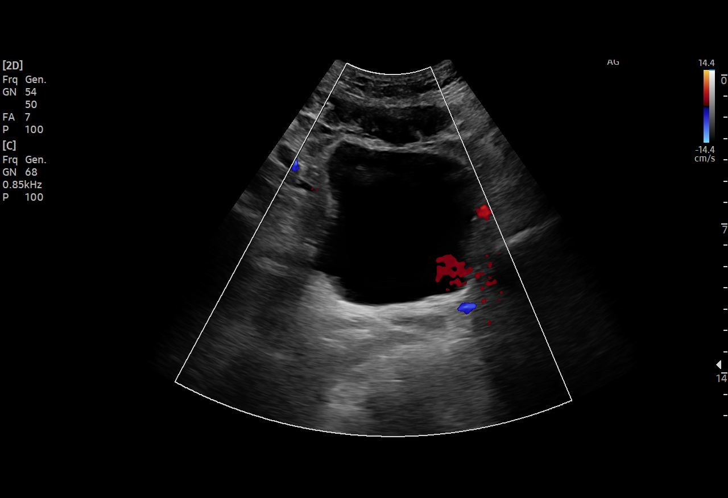

[15 of 22 positions shown; findings below may reference images not displayed]

FINDINGS: Right Kidney:

Renal measurements: 10.1 cm x 5.4 cm x 5.9 cm = volume: 168 mL.
Echogenicity within normal limits. No mass or hydronephrosis
visualized.

Left Kidney:

Renal measurements: 12.2 cm x 5.4 cm x 4.9 cm = volume: 168 mL.
Echogenicity within normal limits. No mass or hydronephrosis
visualized.

Bladder:

Appears normal for degree of bladder distention. The bilateral
ureteral jets are visualized.

Other:

None.
IMPRESSION: Normal renal ultrasound.
